# Patient Record
Sex: Female | Born: 1950 | Race: White | Hispanic: No | Marital: Married | Smoking: Never smoker
Health system: Southern US, Community
[De-identification: ages and names within clinical notes are randomized; demographics above are authoritative.]

## PROBLEM LIST (undated history)

## (undated) DIAGNOSIS — I10 Essential (primary) hypertension: Secondary | ICD-10-CM

## (undated) DIAGNOSIS — E079 Disorder of thyroid, unspecified: Secondary | ICD-10-CM

## (undated) DIAGNOSIS — K219 Gastro-esophageal reflux disease without esophagitis: Secondary | ICD-10-CM

## (undated) DIAGNOSIS — J45909 Unspecified asthma, uncomplicated: Secondary | ICD-10-CM

## (undated) DIAGNOSIS — J302 Other seasonal allergic rhinitis: Secondary | ICD-10-CM

## (undated) HISTORY — PX: CHOLECYSTECTOMY: SHX55

## (undated) HISTORY — PX: ABDOMINAL HYSTERECTOMY: SHX81

---

## 2008-06-05 ENCOUNTER — Encounter: Payer: Self-pay | Admitting: Internal Medicine

## 2008-06-05 LAB — CONVERTED CEMR LAB
Albumin: 4.7 g/dL
BUN: 18 mg/dL
Calcium: 9.7 mg/dL
Creatinine, Ser: 0.73 mg/dL
Glucose, Bld: 108 mg/dL
HDL: 53 mg/dL
Hemoglobin: 12.1 g/dL
LDL Cholesterol: 138 mg/dL
MCV: 88.3 fL
Monocytes Relative: 8.6 %
Neutrophils Relative %: 47.5 %
Platelets: 203 10*3/uL
RDW: 13.9 %
WBC: 4.7 10*3/uL

## 2009-06-17 ENCOUNTER — Encounter: Payer: Self-pay | Admitting: Internal Medicine

## 2009-06-17 LAB — CONVERTED CEMR LAB
Albumin: 4.8 g/dL
Calcium: 9.8 mg/dL
Cholesterol: 233 mg/dL
Creatinine, Ser: 0.73 mg/dL
Glucose, Bld: 104 mg/dL
Potassium: 4 meq/L
Sodium: 137 meq/L
Triglyceride fasting, serum: 96 mg/dL

## 2009-10-06 ENCOUNTER — Ambulatory Visit: Payer: Self-pay | Admitting: Diagnostic Radiology

## 2009-10-06 ENCOUNTER — Encounter: Payer: Self-pay | Admitting: Emergency Medicine

## 2009-10-06 ENCOUNTER — Encounter: Payer: Self-pay | Admitting: Internal Medicine

## 2009-10-06 LAB — CONVERTED CEMR LAB
AST: 38 units/L
Albumin: 4.2 g/dL
Alkaline Phosphatase: 72 units/L
CO2: 20 meq/L
Calcium: 9.3 mg/dL
Chloride: 95 meq/L
Creatinine, Ser: 0.8 mg/dL
Eosinophils Relative: 1 %
HCT: 37 %
MCV: 87.1 fL
Monocytes Relative: 8 %
Neutrophils Relative %: 61 %
Platelets: 313 10*3/uL
RBC: 4.24 M/uL
RDW: 11.8 %
TSH: 0.543 microintl units/mL

## 2009-10-07 ENCOUNTER — Observation Stay (HOSPITAL_COMMUNITY): Admission: EM | Admit: 2009-10-07 | Discharge: 2009-10-08 | Payer: Self-pay | Admitting: Internal Medicine

## 2009-10-07 ENCOUNTER — Encounter: Payer: Self-pay | Admitting: Internal Medicine

## 2009-10-07 DIAGNOSIS — R112 Nausea with vomiting, unspecified: Secondary | ICD-10-CM | POA: Insufficient documentation

## 2009-10-07 DIAGNOSIS — R1013 Epigastric pain: Secondary | ICD-10-CM

## 2009-10-07 LAB — CONVERTED CEMR LAB
BUN: 1 mg/dL
CO2: 23 meq/L
Calcium: 9 mg/dL
Glucose, Bld: 126 mg/dL

## 2009-10-11 ENCOUNTER — Encounter: Payer: Self-pay | Admitting: Internal Medicine

## 2009-10-11 DIAGNOSIS — K589 Irritable bowel syndrome without diarrhea: Secondary | ICD-10-CM

## 2009-10-11 DIAGNOSIS — E039 Hypothyroidism, unspecified: Secondary | ICD-10-CM

## 2009-10-11 DIAGNOSIS — E119 Type 2 diabetes mellitus without complications: Secondary | ICD-10-CM

## 2009-10-11 DIAGNOSIS — I10 Essential (primary) hypertension: Secondary | ICD-10-CM | POA: Insufficient documentation

## 2009-10-11 DIAGNOSIS — J45909 Unspecified asthma, uncomplicated: Secondary | ICD-10-CM

## 2009-10-12 ENCOUNTER — Encounter (INDEPENDENT_AMBULATORY_CARE_PROVIDER_SITE_OTHER): Payer: Self-pay | Admitting: *Deleted

## 2009-10-12 ENCOUNTER — Ambulatory Visit: Payer: Self-pay | Admitting: Internal Medicine

## 2009-10-12 DIAGNOSIS — G43909 Migraine, unspecified, not intractable, without status migrainosus: Secondary | ICD-10-CM

## 2009-10-12 DIAGNOSIS — Z860100 Personal history of colon polyps, unspecified: Secondary | ICD-10-CM | POA: Insufficient documentation

## 2009-10-12 DIAGNOSIS — B370 Candidal stomatitis: Secondary | ICD-10-CM | POA: Insufficient documentation

## 2009-10-12 DIAGNOSIS — J309 Allergic rhinitis, unspecified: Secondary | ICD-10-CM | POA: Insufficient documentation

## 2009-10-12 DIAGNOSIS — E785 Hyperlipidemia, unspecified: Secondary | ICD-10-CM

## 2009-10-12 DIAGNOSIS — K209 Esophagitis, unspecified: Secondary | ICD-10-CM

## 2009-10-12 DIAGNOSIS — Z87448 Personal history of other diseases of urinary system: Secondary | ICD-10-CM | POA: Insufficient documentation

## 2009-10-14 ENCOUNTER — Encounter: Payer: Self-pay | Admitting: Internal Medicine

## 2010-04-26 NOTE — Letter (Signed)
Summary: New Patient letter  [facility]  [address] [place], [place] [address]   Phone: [phone]  Fax: [phone]       10/12/2009 MRN: [id number]  Lynn Stone [address] [place], [place]  [address]  Dear Ms. Kayleen Memos,  Welcome to the Gastroenterology Division at [facility].    You are scheduled to see Dr.  Sheryn Bison on November 18, 2009 at 8:30am on the 3rd floor at [facility], [address]  We ask that you try to arrive at our office 15 minutes prior to your appointment time to allow for check-in.  We would like you to complete the enclosed self-administered evaluation form prior to your visit and bring it with you on the day of your appointment.  We will review it with you.  Also, please bring a complete list of all your medications or, if you prefer, bring the medication bottles and we will list them.  Please bring your insurance card so that we may make a copy of it.  If your insurance requires a referral to see a specialist, please bring your referral form from your primary care physician.  Co-payments are due at the time of your visit and may be paid by cash, check or credit card.     Your office visit will consist of a consult with your physician (includes a physical exam), any laboratory testing he/she may order, scheduling of any necessary diagnostic testing (e.g. x-ray, ultrasound, CT-scan), and scheduling of a procedure (e.g. Endoscopy, Colonoscopy) if required.  Please allow enough time on your schedule to allow for any/all of these possibilities.    If you cannot keep your appointment, please call [phone] to cancel or reschedule prior to your appointment date.  This allows [place] the opportunity to schedule an appointment for another patient in need of care.  If you do not cancel or reschedule by 5 p.m. the business day prior to your appointment date, you will be charged a $50.00 late cancellation/no-show fee.    Thank you for  choosing [facility] for your medical needs.  We appreciate the opportunity to care for you.  Please visit [place] at our website  to learn more about our practice.                     Sincerely,                                                             The Gastroenterology Division

## 2010-04-26 NOTE — Assessment & Plan Note (Signed)
Summary: new / [facility] / dr tempe @[facility]  [place] made appt/cd   Vital Signs:  Patient profile:   60 year old female Weight:      158.6 pounds (72.09 kg) O2 Sat:      95 % on Room air Temp:     98.3 degrees F (36.83 degrees C) oral Pulse rate:   75 / minute BP sitting:   130 / 80  (left arm) Cuff size:   large  Vitals Entered By: Orlan Leavens (October 12, 2009 8:21 AM)  O2 Flow:  Room air CC: New patient Is Patient Diabetic? Yes Did you bring your meter with you today? No Pain Assessment Patient in pain? no        Primary Care Provider:  Newt Lukes MD  CC:  New patient.  History of Present Illness: new pt to me and our practice,  only temp in [place] but est care here for needs that arise while away from home ([place] area)  c/o dysphagia and ondynophagia - onset 3 weeks ago - worsening course - 10/10 at worst (prompting hosp eval and tx) describes as pain burning and sharp, worse with any swallow activity, solids or liquids - pain is constant through SSchest into epigastrium - no radiation into back - exac by recent abx use for "cold" (doxy rx'd) denies weight loss or fever, no change in bowels or BRBPR/melena- pain required overnight hosp for pill esophagitis related to same - hosp 7/14 reviewed - pain symptoms now improved with two times a day ppi - using protonix in place of prev nexium - hx similar symtpoms: GERD and esoph stricrtures requiring dialation in past  Preventive Screening-Counseling & Management  Alcohol-Tobacco     Alcohol drinks/day: 2     Alcohol Counseling: not indicated; use of alcohol is not excessive or problematic     Smoking Status: quit     Tobacco Counseling: not to resume use of tobacco products  Caffeine-Diet-Exercise     Caffeine Counseling: not indicated; caffeine use is not excessive or problematic     Does Patient Exercise: yes     Times/week: 5     Exercise Counseling: not indicated; exercise is adequate     Depression Counseling:  not indicated; screening negative for depression  Safety-Violence-Falls     Seat Belt Counseling: not indicated; patient wears seat belts     Helmet Counseling: not indicated; patient wears helmet when riding bicycle/motocycle     Firearms in the Home: no firearms in the home     Firearm Counseling: not applicable     Smoke Detectors: yes     Violence in the Home: no risk noted     Fall Risk Counseling: not indicated; no significant falls noted  Clinical Review Panels:  Complete Metabolic Panel   Glucose:  126 (10/07/2009)   Sodium:  128 (10/07/2009)   Potassium:  3.6 (10/07/2009)   Chloride:  97 (10/07/2009)   CO2:  23 (10/07/2009)   BUN:  1 (10/07/2009)   Creatinine:  0.60 (10/07/2009)   Albumin:  4.2 (10/06/2009)   Total Protein:  7.4 (10/06/2009)   Calcium:  9.0 (10/07/2009)   Total Bili:  0.6 (10/06/2009)   Alk Phos:  72 (10/06/2009)   SGPT (ALT):  20 (10/06/2009)   SGOT (AST):  38 (10/06/2009)   Current Medications (verified): 1)  Zofran 4 Mg Tabs (Ondansetron Hcl) .... Take 1 Q 6 Hours As Needed 2)  Protonix 40 Mg Tbec (Pantoprazole Sodium) .... Take  1 By Mouth Two Times A Day For 3 Weeks and Then 1 Tablet in Q Am 3)  Advair Diskus 250-50 Mcg/dose Aepb (Fluticasone-Salmeterol) .... Take 1 Puff Two Times A Day 4)  Ramipril 10 Mg Caps (Ramipril) .... Take 1 By Mouth Once Daily 5)  Synthroid 100 Mcg Tabs (Levothyroxine Sodium) .... Take 1 By Mouth Once Daily 6)  Tylenol Extra Strength 500 Mg Tabs (Acetaminophen) .... Use As Needed 7)  Carafate 1 Gm/36ml Susp (Sucralfate) .... Take 1 Teaspoon Qid For 3 Weeks  Allergies (verified): No Known Drug Allergies  Past History:  Past Medical History: Asthma Diabetes mellitus, type II, diet controlled Hypertension Hypothyroidism Allergic rhinitis Colonic polyps, hx of Hyperlipidemia Fibromyalgia GERD - hx esophageal strictures, pill esophagitis hosp 10/07/09 IBS  Past Surgical History: Cholecystectomy  (1992) Hysterectomy (1981)  Family History: Family History of Arthritis Family History of Colon CA 1st degree relative <60 Family History High cholesterol Family History Hypertension Family History of Prostate CA 1st degree relative <50  Social History: Former Smoker,  "moderate" alcohol per her description - married, lives with spouse - only temporary residence in [place] until spouse completed with job at [employer] - returning to [place] area soon - Smoking Status:  quit Does Patient Exercise:  yes  Review of Systems       c/o allergic rhinitis since moving to [place] area x 3 weeks, intol of antihistamine tx SE (dryness of mouth/eyes); otherwise, see HPI above. I have reviewed all other systems and they were negative.   Physical Exam  General:  alert, well-developed, well-nourished, and cooperative to examination.   nontoxic Eyes:  vision grossly intact; pupils equal, round and reactive to light.  conjunctiva and lids normal.    Ears:  normal pinnae bilaterally, without erythema, swelling, or tenderness to palpation. TMs clear, without effusion, or cerumen impaction. Hearing grossly normal bilaterally  Mouth:  teeth and gums in good repair; mucous membranes moist, with candica changes across tongue and OP. no exudate, mod erythema. +clear pnd Neck:  supple, full ROM, no masses, no thyromegaly; no thyroid nodules or tenderness. no JVD or carotid bruits.   Lungs:  normal respiratory effort, no intercostal retractions or use of accessory muscles; normal breath sounds bilaterally - no crackles and no wheezes.    Heart:  normal rate, regular rhythm, no murmur, and no rub. BLE without edema. Abdomen:  soft, non-tender, normal bowel sounds, no distention; no masses and no appreciable hepatomegaly or splenomegaly.   Skin:  no rashes, vesicles, ulcers, or erythema. No nodules or irregularity to palpation.  Psych:  Oriented X3, memory intact for recent and remote, normally interactive, good eye  contact, not anxious appearing, not depressed appearing, and not agitated.      Impression & Recommendations:  Problem # 1:  ESOPHAGITIS (ICD-530.10) recent hosp reviewed -  improved but not resolved - rec cont two times a day ppi and refer to GI for eval (pt request) ?candidasis given DM (diet controlled) and oral findings c/w same - will send for prior records including esoph dialtion hx from [place] -  The following medications were removed from the medication list:    Carafate 1 Gm Tabs (Sucralfate) .Marland Kitchen... Take 1 four times a day for 3  weeks Her updated medication list for this problem includes:    Protonix 40 Mg Tbec (Pantoprazole sodium) .Marland Kitchen... Take 1 by mouth two times a day for 3 weeks and then 1 tablet in q am    Carafate 1 Gm/92ml Susp (  Sucralfate) .Marland Kitchen... Take 1 teaspoon qid for 3 weeks  Orders: Gastroenterology Referral (GI)  Problem # 2:  CANDIDIASIS OF MOUTH (ICD-112.0)  tx with nystatin swish and swallow -  ?affecting esophagitis symptoms -  Orders: Prescription Created Electronically [phone])  Problem # 3:  HYPERLIPIDEMIA (ICD-272.4) send for records Labs Reviewed: SGOT: 38 (10/06/2009)   SGPT: 20 (10/06/2009)  Problem # 4:  HYPOTHYROIDISM (ICD-244.9)  send for records - cont same Her updated medication list for this problem includes:    Synthroid 100 Mcg Tabs (Levothyroxine sodium) .Marland Kitchen... Take 1 by mouth once daily  Labs Reviewed: TSH: 0.543 (10/06/2009)     Problem # 5:  DIABETES MELLITUS, TYPE II (ICD-250.00)  diet controlled per pt hx - consider checking a1c if not recently done in [place] - records requested Her updated medication list for this problem includes:    Ramipril 10 Mg Caps (Ramipril) .Marland Kitchen... Take 1 by mouth once daily  Labs Reviewed: Creat: 0.60 (10/07/2009)     Complete Medication List: 1)  Zofran 4 Mg Tabs (Ondansetron hcl) .... Take 1 q 6 hours as needed 2)  Protonix 40 Mg Tbec (Pantoprazole sodium) .... Take 1 by mouth two times a day for 3  weeks and then 1 tablet in q am 3)  Advair Diskus 250-50 Mcg/dose Aepb (Fluticasone-salmeterol) .... Take 1 puff two times a day 4)  Ramipril 10 Mg Caps (Ramipril) .... Take 1 by mouth once daily 5)  Synthroid 100 Mcg Tabs (Levothyroxine sodium) .... Take 1 by mouth once daily 6)  Tylenol Extra Strength 500 Mg Tabs (Acetaminophen) .... Use as needed 7)  Carafate 1 Gm/33ml Susp (Sucralfate) .... Take 1 teaspoon qid for 3 weeks 8)  Nystatin 100000 Unit/ml Susp (Nystatin) .Marland Kitchen.. 10 cc swish, gargle and spit four times a day 9)  Align Caps (Probiotic product) .Marland Kitchen.. 1 by mouth once daily  Patient Instructions: 1)  it was good to see you today. 2)  take medication for allergy symptoms as discussed - no need for antibiotics at this time - 3)  Nystatin mouthwash for the yeast as discussed - your prescription has been electronically submitted to your local pharmacy. Please take as directed. Contact our office if you believe you're having problems with the medication(s).  4)  we'll make referral to [facility] for evaluation of your swallow problems and pain . Our office will contact you regarding this appointment once made.  5)  otherwise, no medicine changes 6)  will send for copy of records from your dr, Laural Benes to review as discussed - 7)  Please schedule a follow-up appointment as needed while your are in this area, call if problems.  Prescriptions: NYSTATIN 100000 UNIT/ML SUSP (NYSTATIN) 10 cc swish, gargle and spit four times a day  #1 large x 1   Entered and Authorized by:   Newt Lukes MD   Signed by:   Newt Lukes MD on 10/12/2009   Method used:   Electronically to        [facility] [address] [id number] (retail)       [address]       [place], [place]  [address]       Ph: [phone]       Fax: [phone]   RxID:   [id number]

## 2010-06-12 LAB — COMPREHENSIVE METABOLIC PANEL
Alkaline Phosphatase: 72 U/L (ref 39–117)
BUN: 7 mg/dL (ref 6–23)
Creatinine, Ser: 0.8 mg/dL (ref 0.4–1.2)
Potassium: 2.9 mEq/L — ABNORMAL LOW (ref 3.5–5.1)
Sodium: 128 mEq/L — ABNORMAL LOW (ref 135–145)
Total Bilirubin: 0.6 mg/dL (ref 0.3–1.2)

## 2010-06-12 LAB — RAPID STREP SCREEN (MED CTR MEBANE ONLY): Streptococcus, Group A Screen (Direct): NEGATIVE

## 2010-06-12 LAB — URINALYSIS, ROUTINE W REFLEX MICROSCOPIC
Bilirubin Urine: NEGATIVE
Hgb urine dipstick: NEGATIVE
Specific Gravity, Urine: 1.003 — ABNORMAL LOW (ref 1.005–1.030)
Urobilinogen, UA: 0.2 mg/dL (ref 0.0–1.0)
pH: 6.5 (ref 5.0–8.0)

## 2010-06-12 LAB — CBC
Hemoglobin: 12.8 g/dL (ref 12.0–15.0)
MCH: 30.1 pg (ref 26.0–34.0)
MCHC: 34.6 g/dL (ref 30.0–36.0)
RDW: 11.8 % (ref 11.5–15.5)
WBC: 7.8 10*3/uL (ref 4.0–10.5)

## 2010-06-12 LAB — POCT CARDIAC MARKERS
Myoglobin, poc: 41.5 ng/mL (ref 12–200)
Troponin i, poc: <0.05 ng/mL (ref 0.00–0.09)

## 2010-06-12 LAB — LIPASE, BLOOD: Lipase: 391 U/L — ABNORMAL HIGH (ref 23–300)

## 2010-06-12 LAB — BASIC METABOLIC PANEL
BUN: 1 mg/dL — ABNORMAL LOW (ref 6–23)
CO2: 23 mEq/L (ref 19–32)
Calcium: 9 mg/dL (ref 8.4–10.5)
Chloride: 97 mEq/L (ref 96–112)
Glucose, Bld: 126 mg/dL — ABNORMAL HIGH (ref 70–99)
Sodium: 128 mEq/L — ABNORMAL LOW (ref 135–145)

## 2010-06-12 LAB — ETHANOL: Alcohol, Ethyl (B): <10 mg/dL (ref 0–10)

## 2010-06-12 LAB — DIFFERENTIAL
Basophils Absolute: 0 10*3/uL (ref 0.0–0.1)
Basophils Relative: 0 % (ref 0–1)
Eosinophils Relative: 1 % (ref 0–5)
Monocytes Relative: 8 % (ref 3–12)

## 2010-06-12 LAB — TSH: TSH: 0.543 u[IU]/mL (ref 0.350–4.500)

## 2014-02-11 ENCOUNTER — Emergency Department
Admission: EM | Admit: 2014-02-11 | Discharge: 2014-02-11 | Disposition: A | Discharge status: Home / Self Care | Payer: Managed Care, Other (non HMO) | Source: Home / Self Care | Attending: Emergency Medicine | Admitting: Emergency Medicine

## 2014-02-11 ENCOUNTER — Encounter: Payer: Self-pay | Admitting: *Deleted

## 2014-02-11 DIAGNOSIS — H6981 Other specified disorders of Eustachian tube, right ear: Secondary | ICD-10-CM

## 2014-02-11 DIAGNOSIS — H9201 Otalgia, right ear: Secondary | ICD-10-CM

## 2014-02-11 DIAGNOSIS — J4520 Mild intermittent asthma, uncomplicated: Secondary | ICD-10-CM

## 2014-02-11 DIAGNOSIS — R258 Other abnormal involuntary movements: Secondary | ICD-10-CM

## 2014-02-11 DIAGNOSIS — J0100 Acute maxillary sinusitis, unspecified: Secondary | ICD-10-CM

## 2014-02-11 DIAGNOSIS — R253 Fasciculation: Secondary | ICD-10-CM

## 2014-02-11 HISTORY — DX: Disorder of thyroid, unspecified: E07.9

## 2014-02-11 HISTORY — DX: Unspecified asthma, uncomplicated: J45.909

## 2014-02-11 HISTORY — DX: Gastro-esophageal reflux disease without esophagitis: K21.9

## 2014-02-11 HISTORY — DX: Essential (primary) hypertension: I10

## 2014-02-11 HISTORY — DX: Other seasonal allergic rhinitis: J30.2

## 2014-02-11 MED ORDER — CEFTRIAXONE SODIUM 250 MG IJ SOLR
500.0000 mg | Freq: Once | INTRAMUSCULAR | Status: AC
  Administered 2014-02-11: 500 mg via INTRAMUSCULAR

## 2014-02-11 MED ORDER — PREDNISONE 20 MG PO TABS: 20.0000 mg | ORAL_TABLET | Freq: Two times a day (BID) | ORAL | Status: DC

## 2014-02-11 MED ORDER — ALBUTEROL SULFATE HFA 108 (90 BASE) MCG/ACT IN AERS: 2.0000 | INHALATION_SPRAY | RESPIRATORY_TRACT | Status: AC | PRN

## 2014-02-11 MED ORDER — FLUTICASONE PROPIONATE 50 MCG/ACT NA SUSP: NASAL | Status: AC

## 2014-02-11 MED ORDER — AMOXICILLIN 875 MG PO TABS: ORAL_TABLET | ORAL | Status: DC

## 2014-02-11 NOTE — ED Provider Notes (Addendum)
CSN: [id number]     Arrival date & time 02/11/14  1529 History   First MD Initiated Contact with Patient 02/11/14 1621     Chief Complaint  Patient presents with  . Otalgia  . Eye Problem   Lynn Stone is a 63 year old female. Her PCP is Dr. Luella Cook in [place]. Family moved here from [place] temporarily, but is moving back to [place] next month. She presents to [facility] with numerous symptoms.  HPI Chief complaint right ear and facial pain and right eye twitching intermittently for 5 days. She feels her seasonal allergies have flared up which could contribute to sinus congestion and sinus pain. Has vague feeling of numbness right side of face. Denies focal weakness or other focal neurologic symptoms. She states that sodium and potassium have been abnormal in the past when she's been dehydrated and she requests labs drawn today to check potassium and sodium. She requests a refill her albuterol HFA in case she were to need it. She has all of her other prescriptions including Altace, Synthroid, Nexium, Advair.--She states she is compliant with those meds I questioned her about fever, initially she is not sure if she has had fever but she's felt warm. Had some chills and night sweats. Also complains of myalgias, fatigue, vague nonfocal headache, sore throat, sinus congestion, hoarseness. Has decreased appetite past few days but tolerating by mouth liquids denies nausea or vomiting. Had one semi-loose stool today. No blood or mucus in stool. History of chronic asthma, but denies recent asthma flareup. Denies wheezing or shortness of breath or chest pain. Denies any urinary symptoms. No dysuria or hematuria. Denies abdominal or pelvic pain. No seizures or syncope. Denies tick bite or rash. Past Medical History  Diagnosis Date  . Asthma   . Seasonal allergies   . GERD (gastroesophageal reflux disease)   . Hypertension   . Thyroid disease    Past Surgical History   Procedure Laterality Date  . Cesarean section      x 3  . Abdominal hysterectomy    . Cholecystectomy     Family History  Problem Relation Age of Onset  . Heart disease Father    History  Substance Use Topics  . Smoking status: Never Smoker   . Smokeless tobacco: Not on file  . Alcohol Use: No   OB History    No data available     Review of Systems  All other systems reviewed and are negative.   Allergies  Eggs or egg-derived products and Erythromycin  Home Medications   Prior to Admission medications   Medication Sig Start Date End Date Taking? Authorizing Provider  albuterol (PROVENTIL HFA;VENTOLIN HFA) 108 (90 BASE) MCG/ACT inhaler Inhale into the lungs every 6 (six) hours as needed for wheezing or shortness of breath.   Yes Historical Provider, MD  esomeprazole (NEXIUM) 40 MG capsule Take 40 mg by mouth daily at 12 noon.   Yes Historical Provider, MD  Fluticasone-Salmeterol (ADVAIR DISKUS) 250-50 MCG/DOSE AEPB Inhale 1 puff into the lungs 2 (two) times daily.   Yes Historical Provider, MD  levothyroxine (SYNTHROID, LEVOTHROID) 100 MCG tablet Take 100 mcg by mouth daily before breakfast.   Yes Historical Provider, MD  ramipril (ALTACE) 10 MG capsule Take 10 mg by mouth daily.   Yes Historical Provider, MD  albuterol (PROVENTIL HFA;VENTOLIN HFA) 108 (90 BASE) MCG/ACT inhaler Inhale 2 puffs into the lungs every 4 (four) hours as needed for wheezing or shortness of breath. 02/11/14  Jacqulyn Cane, MD  amoxicillin (AMOXIL) 875 MG tablet Take 1 twice a day X 10 days. 02/11/14   Jacqulyn Cane, MD  fluticasone Asencion Islam) 50 MCG/ACT nasal spray 1 or 2 sprays each nostril twice a day 02/11/14   Jacqulyn Cane, MD  predniSONE (DELTASONE) 20 MG tablet Take 1 tablet (20 mg total) by mouth 2 (two) times daily with a meal. X 5 days 02/11/14   Jacqulyn Cane, MD   BP 188/91 mmHg  Pulse 77  Temp(Src) 97.6 F (36.4 C) (Oral)  Resp 18  Ht 4\' 11"  (1.499 m)  Wt 170 lb (77.111 kg)  BMI  34.32 kg/m2  SpO2 99% Physical Exam  Constitutional: She is oriented to person, place, and time. She appears well-developed and well-nourished. No distress.  Alert, pleasant female. No acute distress. She appears uncomfortable and anxious. Cooperative. Talkative  HENT:  Head: Normocephalic and atraumatic. Head is without right periorbital erythema and without left periorbital erythema.  Right Ear: External ear and ear canal normal. Tympanic membrane is injected. Tympanic membrane is not perforated and not erythematous. A middle ear effusion is present.  Left Ear: Tympanic membrane, external ear and ear canal normal.  Nose: Mucosal edema and rhinorrhea present. Right sinus exhibits maxillary sinus tenderness. Left sinus exhibits maxillary sinus tenderness.  Mouth/Throat: Oropharynx is clear and moist and mucous membranes are normal. No oral lesions. No oropharyngeal exudate.  Eyes: Conjunctivae, EOM and lids are normal. Pupils are equal, round, and reactive to light. Right eye exhibits no discharge. Left eye exhibits no discharge. Right conjunctiva has no hemorrhage. Left conjunctiva has no hemorrhage. No scleral icterus.  Occasional twitching right eyelid noted. No other eye abnormality noted  Neck: Trachea normal. Neck supple. No JVD present. No rigidity.  No adenopathy  Cardiovascular: Normal rate, regular rhythm and normal heart sounds.   Pulmonary/Chest: Effort normal and breath sounds normal. No respiratory distress. She has no wheezes. She has no rales.  Abdominal: Soft. She exhibits no distension.  Musculoskeletal: She exhibits no edema.  Lymphadenopathy:    She has no cervical adenopathy.  Neurological: She is alert and oriented to person, place, and time. She displays normal reflexes. No cranial nerve deficit. She exhibits normal muscle tone. Coordination normal.  No focal neurologic deficit. Motor and sensory intact.  Skin: Skin is warm and dry. No rash noted.  Psychiatric: She has  a normal mood and affect.  Mildly anxious  Nursing note and vitals reviewed.   ED Course  Procedures (including critical care time) Labs Review Labs Reviewed  BASIC METABOLIC PANEL    Imaging Review No results found.   MDM   1. Acute maxillary sinusitis, recurrence not specified   2. Otalgia of right ear   3. Eustachian tube dysfunction, right   4. Muscle twitching   5. Asthma, chronic, mild intermittent, uncomplicated     Over 45 minutes spent, greater than 50% of the time spent for counseling and coordination of care. She had numerous questions and I answered as best I could. I explained that neurologic exam intact, no evidence of facial asymmetry or neurologic deficit.  At her request, BMP drawn to check sodium and potassium. Antibiotic choices discussed. She prefers shot of antibiotic, Rocephin 500 IM given. Prescribed amoxicillin 875 twice a day Prescribed Flonase. At her request, I refilled albuterol HFA (use prn) because she ran out. She does not have any wheezing today on exam Names and phone numbers of ENT  given. Follow-up with ENT in 3-4 days. Precautions  discussed. Red flags discussed.--Emergency room if any red flag. Questions invited and answered. Patient voiced understanding and agreement. BP rechecked by me 158/88. She admits that she is somewhat anxious today. After talking with her, she she stated that felt less anxious. Push clear liquids. Advance to regular diet as tolerated. Other advice given.  Discharge Medication List as of 02/11/2014  4:38 PM    START taking these medications   Details   albuterol (PROVENTIL HFA;VENTOLIN HFA) 108 (90 BASE) MCG/ACT inhaler Inhale 2 puffs into the lungs every 4 (four) hours as needed for wheezing or shortness of breath    amoxicillin (AMOXIL) 875 MG tablet Take 1 twice a day X 10 days    fluticasone (FLONASE) 50 MCG/ACT nasal spray 1 or 2 sprays each nostril twice a day    predniSONE (DELTASONE) 20 MG tablet  Take 1 tablet (20 mg total) by mouth 2 (two) times daily with a meal. X 5 days, Starting 02/11/2014, Until Discontinued         Follow-up with your primary care doctor in 5-7 days, or sooner if symptoms become worse. Precautions discussed. Red flags discussed.--Emergency room if any red flag Questions invited and answered. Patient voiced understanding and agreement.    Jacqulyn Cane, MD 02/12/14 8335  Jacqulyn Cane, MD 02/12/14 [phone]

## 2014-02-11 NOTE — ED Notes (Signed)
Pt c/o RT ear pain with RT eye twitching x 5 days. She reports a hx of seasonal allergies. Denies fever. She also reports that it feels like the RT side of her head is numb. She request to have labs drawn to check potassium and sodium.

## 2014-02-12 LAB — BASIC METABOLIC PANEL
BUN: 10 mg/dL (ref 6–23)
CO2: 24 mEq/L (ref 19–32)
Calcium: 10.2 mg/dL (ref 8.4–10.5)
Chloride: 91 mEq/L — ABNORMAL LOW (ref 96–112)
Creat: 0.53 mg/dL (ref 0.50–1.10)
Glucose, Bld: 94 mg/dL (ref 70–99)
Potassium: 4 mEq/L (ref 3.5–5.3)
Sodium: 124 mEq/L — ABNORMAL LOW (ref 135–145)

## 2014-02-12 MED ORDER — AMOXICILLIN 250 MG/5ML PO SUSR: 500.0000 mg | Freq: Three times a day (TID) | ORAL | Status: DC

## 2014-02-12 MED ORDER — PREDNISONE 5 MG/5ML PO SOLN: 20.0000 mg | Freq: Two times a day (BID) | ORAL | Status: DC

## 2014-02-17 ENCOUNTER — Telehealth: Payer: Self-pay | Admitting: *Deleted

## 2016-04-23 ENCOUNTER — Encounter: Payer: Self-pay | Admitting: Student

## 2019-03-17 ENCOUNTER — Other Ambulatory Visit: Payer: Self-pay

## 2019-03-17 ENCOUNTER — Emergency Department (HOSPITAL_COMMUNITY): Payer: Medicare HMO

## 2019-03-17 ENCOUNTER — Inpatient Hospital Stay (HOSPITAL_COMMUNITY)
Admission: EM | Admit: 2019-03-17 | Discharge: 2019-03-20 | DRG: 645 | Disposition: A | Discharge status: Home / Self Care | Payer: Medicare HMO | Attending: Internal Medicine | Admitting: Internal Medicine

## 2019-03-17 ENCOUNTER — Observation Stay (HOSPITAL_COMMUNITY): Payer: Medicare HMO

## 2019-03-17 ENCOUNTER — Encounter (HOSPITAL_COMMUNITY): Payer: Self-pay | Admitting: Emergency Medicine

## 2019-03-17 DIAGNOSIS — G43A0 Cyclical vomiting, not intractable: Secondary | ICD-10-CM | POA: Diagnosis present

## 2019-03-17 DIAGNOSIS — E871 Hypo-osmolality and hyponatremia: Secondary | ICD-10-CM | POA: Diagnosis present

## 2019-03-17 DIAGNOSIS — R112 Nausea with vomiting, unspecified: Secondary | ICD-10-CM | POA: Diagnosis not present

## 2019-03-17 DIAGNOSIS — E876 Hypokalemia: Secondary | ICD-10-CM | POA: Diagnosis present

## 2019-03-17 DIAGNOSIS — Z881 Allergy status to other antibiotic agents status: Secondary | ICD-10-CM

## 2019-03-17 DIAGNOSIS — R197 Diarrhea, unspecified: Secondary | ICD-10-CM | POA: Diagnosis present

## 2019-03-17 DIAGNOSIS — Z9049 Acquired absence of other specified parts of digestive tract: Secondary | ICD-10-CM

## 2019-03-17 DIAGNOSIS — K209 Esophagitis, unspecified without bleeding: Secondary | ICD-10-CM | POA: Diagnosis present

## 2019-03-17 DIAGNOSIS — E039 Hypothyroidism, unspecified: Secondary | ICD-10-CM | POA: Diagnosis present

## 2019-03-17 DIAGNOSIS — K2100 Gastro-esophageal reflux disease with esophagitis, without bleeding: Secondary | ICD-10-CM | POA: Diagnosis present

## 2019-03-17 DIAGNOSIS — J302 Other seasonal allergic rhinitis: Secondary | ICD-10-CM | POA: Diagnosis present

## 2019-03-17 DIAGNOSIS — E119 Type 2 diabetes mellitus without complications: Secondary | ICD-10-CM | POA: Diagnosis present

## 2019-03-17 DIAGNOSIS — Z90710 Acquired absence of both cervix and uterus: Secondary | ICD-10-CM

## 2019-03-17 DIAGNOSIS — E785 Hyperlipidemia, unspecified: Secondary | ICD-10-CM | POA: Diagnosis present

## 2019-03-17 DIAGNOSIS — Z20828 Contact with and (suspected) exposure to other viral communicable diseases: Secondary | ICD-10-CM | POA: Diagnosis present

## 2019-03-17 DIAGNOSIS — J45909 Unspecified asthma, uncomplicated: Secondary | ICD-10-CM

## 2019-03-17 DIAGNOSIS — Z79899 Other long term (current) drug therapy: Secondary | ICD-10-CM

## 2019-03-17 DIAGNOSIS — Z8249 Family history of ischemic heart disease and other diseases of the circulatory system: Secondary | ICD-10-CM

## 2019-03-17 DIAGNOSIS — G43909 Migraine, unspecified, not intractable, without status migrainosus: Secondary | ICD-10-CM | POA: Diagnosis present

## 2019-03-17 DIAGNOSIS — K589 Irritable bowel syndrome without diarrhea: Secondary | ICD-10-CM | POA: Diagnosis present

## 2019-03-17 DIAGNOSIS — I10 Essential (primary) hypertension: Secondary | ICD-10-CM | POA: Diagnosis not present

## 2019-03-17 DIAGNOSIS — Z79890 Hormone replacement therapy (postmenopausal): Secondary | ICD-10-CM

## 2019-03-17 DIAGNOSIS — Z91012 Allergy to eggs: Secondary | ICD-10-CM

## 2019-03-17 DIAGNOSIS — D72829 Elevated white blood cell count, unspecified: Secondary | ICD-10-CM | POA: Diagnosis present

## 2019-03-17 DIAGNOSIS — E222 Syndrome of inappropriate secretion of antidiuretic hormone: Principal | ICD-10-CM | POA: Diagnosis present

## 2019-03-17 DIAGNOSIS — G4700 Insomnia, unspecified: Secondary | ICD-10-CM | POA: Diagnosis present

## 2019-03-17 LAB — COMPREHENSIVE METABOLIC PANEL
ALT: 26 U/L (ref 0–44)
AST: 32 U/L (ref 15–41)
Albumin: 4.7 g/dL (ref 3.5–5.0)
Alkaline Phosphatase: 74 U/L (ref 38–126)
Anion gap: 14 (ref 5–15)
BUN: 8 mg/dL (ref 8–23)
CO2: 22 mmol/L (ref 22–32)
Calcium: 9.6 mg/dL (ref 8.9–10.3)
Chloride: 79 mmol/L — ABNORMAL LOW (ref 98–111)
Creatinine, Ser: 0.62 mg/dL (ref 0.44–1.00)
GFR calc Af Amer: >60 mL/min (ref >60)
GFR calc non Af Amer: >60 mL/min (ref >60)
Glucose, Bld: 190 mg/dL — ABNORMAL HIGH (ref 70–99)
Potassium: 2.8 mmol/L — ABNORMAL LOW (ref 3.5–5.1)
Sodium: 115 mmol/L — CL (ref 135–145)
Total Bilirubin: 0.8 mg/dL (ref 0.3–1.2)
Total Protein: 7.5 g/dL (ref 6.5–8.1)

## 2019-03-17 LAB — URINALYSIS, ROUTINE W REFLEX MICROSCOPIC
Bacteria, UA: NONE SEEN
Bilirubin Urine: NEGATIVE
Glucose, UA: 50 mg/dL — AB
Hgb urine dipstick: NEGATIVE
Ketones, ur: 20 mg/dL — AB
Leukocytes,Ua: NEGATIVE
Nitrite: NEGATIVE
Protein, ur: 30 mg/dL — AB
Specific Gravity, Urine: 1.009 (ref 1.005–1.030)
pH: 7 (ref 5.0–8.0)

## 2019-03-17 LAB — SODIUM, URINE, RANDOM: Sodium, Ur: 64 mmol/L

## 2019-03-17 LAB — OSMOLALITY, URINE: Osmolality, Ur: 357 mOsm/kg (ref 300–900)

## 2019-03-17 LAB — LIPASE, BLOOD: Lipase: 21 U/L (ref 11–51)

## 2019-03-17 LAB — HEMOGLOBIN A1C
Hgb A1c MFr Bld: 6 % — ABNORMAL HIGH (ref 4.8–5.6)
Mean Plasma Glucose: 125.5 mg/dL

## 2019-03-17 LAB — CBC
HCT: 38.2 % (ref 36.0–46.0)
Hemoglobin: 13.6 g/dL (ref 12.0–15.0)
MCH: 28.8 pg (ref 26.0–34.0)
MCHC: 35.6 g/dL (ref 30.0–36.0)
MCV: 80.8 fL (ref 80.0–100.0)
Platelets: 320 10*3/uL (ref 150–400)
RBC: 4.73 MIL/uL (ref 3.87–5.11)
RDW: 12.3 % (ref 11.5–15.5)
WBC: 15 10*3/uL — ABNORMAL HIGH (ref 4.0–10.5)
nRBC: 0 % (ref 0.0–0.2)

## 2019-03-17 LAB — GLUCOSE, CAPILLARY: Glucose-Capillary: 141 mg/dL — ABNORMAL HIGH (ref 70–99)

## 2019-03-17 LAB — SARS CORONAVIRUS 2 (TAT 6-24 HRS): SARS Coronavirus 2: NEGATIVE

## 2019-03-17 LAB — SODIUM: Sodium: 116 mmol/L — CL (ref 135–145)

## 2019-03-17 LAB — POC SARS CORONAVIRUS 2 AG -  ED: SARS Coronavirus 2 Ag: NEGATIVE

## 2019-03-17 LAB — MAGNESIUM: Magnesium: 1.9 mg/dL (ref 1.7–2.4)

## 2019-03-17 MED ORDER — INSULIN ASPART 100 UNIT/ML ~~LOC~~ SOLN: 0.0000 [IU] | Freq: Every day | SUBCUTANEOUS | Status: DC

## 2019-03-17 MED ORDER — STERILE WATER FOR INJECTION IJ SOLN
INTRAMUSCULAR | Status: AC
  Administered 2019-03-17: 10 mL
  Filled 2019-03-17: qty 10

## 2019-03-17 MED ORDER — INSULIN ASPART 100 UNIT/ML ~~LOC~~ SOLN: 0.0000 [IU] | Freq: Three times a day (TID) | SUBCUTANEOUS | Status: DC

## 2019-03-17 MED ORDER — PANTOPRAZOLE SODIUM 40 MG IV SOLR
40.0000 mg | INTRAVENOUS | Status: DC
  Administered 2019-03-17 – 2019-03-18 (×2): 40 mg via INTRAVENOUS
  Filled 2019-03-17 (×2): qty 40

## 2019-03-17 MED ORDER — SODIUM CHLORIDE 0.9% FLUSH: 3.0000 mL | Freq: Once | INTRAVENOUS | Status: DC

## 2019-03-17 MED ORDER — POTASSIUM CHLORIDE 10 MEQ/100ML IV SOLN
10.0000 meq | INTRAVENOUS | Status: AC
  Administered 2019-03-17 (×3): 10 meq via INTRAVENOUS
  Filled 2019-03-17 (×3): qty 100

## 2019-03-17 MED ORDER — HYDRALAZINE HCL 25 MG PO TABS
25.0000 mg | ORAL_TABLET | Freq: Three times a day (TID) | ORAL | Status: DC | PRN
  Administered 2019-03-17 – 2019-03-20 (×3): 25 mg via ORAL
  Filled 2019-03-17 (×3): qty 1

## 2019-03-17 MED ORDER — ONDANSETRON HCL 4 MG/2ML IJ SOLN
4.0000 mg | Freq: Four times a day (QID) | INTRAMUSCULAR | Status: DC | PRN
  Administered 2019-03-17 – 2019-03-19 (×2): 4 mg via INTRAVENOUS
  Filled 2019-03-17 (×2): qty 2

## 2019-03-17 MED ORDER — SODIUM CHLORIDE 0.9 % IV BOLUS
500.0000 mL | Freq: Once | INTRAVENOUS | Status: AC
  Administered 2019-03-17: 500 mL via INTRAVENOUS

## 2019-03-17 MED ORDER — MAGNESIUM SULFATE 2 GM/50ML IV SOLN
2.0000 g | Freq: Once | INTRAVENOUS | Status: AC
  Administered 2019-03-17: 2 g via INTRAVENOUS
  Filled 2019-03-17: qty 50

## 2019-03-17 MED ORDER — ACETAMINOPHEN 325 MG PO TABS: 650.0000 mg | ORAL_TABLET | ORAL | Status: DC | PRN

## 2019-03-17 MED ORDER — SODIUM CHLORIDE 0.9 % IV SOLN: INTRAVENOUS | Status: DC

## 2019-03-17 NOTE — H&P (Signed)
History and Physical    Lynn Stone DOB: 1950-09-14 DOA: 03/17/2019  PCP: Brett Canales, MD   Patient coming from: Home   I have personally briefly reviewed patient's old medical records in [place]  Chief Complaint: nausea, vomiting and diarrhea for one week.   HPI: Lynn Stone is a 68 y.o. female with medical history significant of asthma, esophagitis, GERD, hypertension, hypothyroidism, presents to ED with complaints of nausea, vomiting and diarrhea for over a week, not associated with abdominal pain. She denies any fevers , reports occasional chills. She reports having asthma and occasional dry cough. She denies sob, fevers or productive cough. She denies hematemesis, hematochezia, dysuria. She reports having occasional headaches, no blurry vision , no tingling or numbness in her extremities. She reports her sodium level has always been low and never worked up. She also reports having a long history of insomnia.   ED Course: On arrival to ED, she was found to be afebrile, RR of 23/min, BP of 159/97 mmhg, lab work revealed sodium of 115, potassium of 2.8, chloride of 79, creatinine of 0.62, magnesium of 1.9. wbc count of 15, glucose of 190, COVID 19 is pending, .  cxr does not show any pneumonia or acute findings.  EKG shows NSR with LVH.   PT referred to [facility] for admission for hyponatremia and hypokalemia and evaluation of diarrhea.   Review of Systems: As per HPI otherwise  All others reviewed and are negative.   Past Medical History:  Diagnosis Date  . Asthma   . GERD (gastroesophageal reflux disease)   . Hypertension   . Seasonal allergies   . Thyroid disease     Past Surgical History:  Procedure Laterality Date  . ABDOMINAL HYSTERECTOMY    . CESAREAN SECTION     x 3  . CHOLECYSTECTOMY     Social history:   reports that she has never smoked. She has never used smokeless tobacco. She reports that she does not drink alcohol or use  drugs.  Allergies  Allergen Reactions  . Eggs Or Egg-Derived Products   . Erythromycin     Family History  Problem Relation Age of Onset  . Heart disease Father    Family history reviewed and positive for insomnia.   Prior to Admission medications   Medication Sig Start Date End Date Taking? Authorizing Provider  albuterol (PROVENTIL HFA;VENTOLIN HFA) 108 (90 BASE) MCG/ACT inhaler Inhale into the lungs every 6 (six) hours as needed for wheezing or shortness of breath.    [provider]  albuterol (PROVENTIL HFA;VENTOLIN HFA) 108 (90 BASE) MCG/ACT inhaler Inhale 2 puffs into the lungs every 4 (four) hours as needed for wheezing or shortness of breath. 02/11/14   Jacqulyn Cane, MD  amoxicillin (AMOXIL) 250 MG/5ML suspension Take 10 mLs (500 mg total) by mouth 3 (three) times daily. For 10 days 02/12/14   Jacqulyn Cane, MD  amoxicillin (AMOXIL) 875 MG tablet Take 1 twice a day X 10 days. 02/11/14   Jacqulyn Cane, MD  esomeprazole (NEXIUM) 40 MG capsule Take 40 mg by mouth daily at 12 noon.    [provider]  fluticasone Asencion Islam) 50 MCG/ACT nasal spray 1 or 2 sprays each nostril twice a day 02/11/14   Jacqulyn Cane, MD  Fluticasone-Salmeterol (ADVAIR DISKUS) 250-50 MCG/DOSE AEPB Inhale 1 puff into the lungs 2 (two) times daily.    [provider]  levothyroxine (SYNTHROID, LEVOTHROID) 100 MCG tablet Take 100 mcg by mouth daily before  breakfast.    [provider]  predniSONE (DELTASONE) 20 MG tablet Take 1 tablet (20 mg total) by mouth 2 (two) times daily with a meal. X 5 days 02/11/14   Jacqulyn Cane, MD  predniSONE 5 MG/5ML solution Take 20 mLs (20 mg total) by mouth 2 (two) times daily. For 5 days. 02/12/14   Jacqulyn Cane, MD  ramipril (ALTACE) 10 MG capsule Take 10 mg by mouth daily.    [provider]    Physical Exam:  Constitutional: appears calm and comfortable.  Vitals:   03/17/19 1445 03/17/19 1500 03/17/19 1545 03/17/19 1610   BP: (!) 175/89 (!) 177/89 (!) 184/92 (!) 159/97  Pulse: 89 89 88 86  Resp: (!) 21 19 (!) 30 (!) 27  Temp:      TempSrc:      SpO2: 97% 96% 96% 97%   Eyes: PERRL, lids and conjunctivae normal ENMT: Mucous membranes are dry Neck: normal, supple,  Respiratory: clear to auscultation bilaterally, no wheezing, no crackles. Normal respiratory effort. No accessory muscle use.  Cardiovascular: Regular rate and rhythm, no murmurs / rubs / gallops.  Trace pedal edema.  Abdomen: no tenderness, abd is soft, non distended.  Bowel sounds positive.  Musculoskeletal: no clubbing / cyanosis.  Skin: no rashes, lesions, ulcers Neurologic: alert and oriented to place and person, able to  Move all extremities. Marland Kitchen  Psychiatric: . Alert and oriented x 3.  Anxious.     Labs on Admission: I have personally reviewed following labs and imaging studies  CBC: Recent Labs  Lab 03/17/19 1050  WBC 15.0*  HGB 13.6  HCT 38.2  MCV 80.8  PLT 99991111   Basic Metabolic Panel: Recent Labs  Lab 03/17/19 1050 03/17/19 1220  NA 115*  --   K 2.8*  --   CL 79*  --   CO2 22  --   GLUCOSE 190*  --   BUN 8  --   CREATININE 0.62  --   CALCIUM 9.6  --   MG  --  1.9   GFR: CrCl cannot be calculated (Unknown ideal weight.). Liver Function Tests: Recent Labs  Lab 03/17/19 1050  AST 32  ALT 26  ALKPHOS 74  BILITOT 0.8  PROT 7.5  ALBUMIN 4.7   Recent Labs  Lab 03/17/19 1050  LIPASE 21   No results for input(s): AMMONIA in the last 168 hours. Coagulation Profile: No results for input(s): INR, PROTIME in the last 168 hours. Cardiac Enzymes: No results for input(s): CKTOTAL, CKMB, CKMBINDEX, TROPONINI in the last 168 hours. BNP (last 3 results) No results for input(s): PROBNP in the last 8760 hours. HbA1C: No results for input(s): HGBA1C in the last 72 hours. CBG: No results for input(s): GLUCAP in the last 168 hours. Lipid Profile: No results for input(s): CHOL, HDL, LDLCALC, TRIG, CHOLHDL, LDLDIRECT  in the last 72 hours. Thyroid Function Tests: No results for input(s): TSH, T4TOTAL, FREET4, T3FREE, THYROIDAB in the last 72 hours. Anemia Panel: No results for input(s): VITAMINB12, FOLATE, FERRITIN, TIBC, IRON, RETICCTPCT in the last 72 hours. Urine analysis:    Component Value Date/Time   COLORURINE YELLOW 10/06/2009 1915   APPEARANCEUR CLEAR 10/06/2009 1915   LABSPEC 1.003 (L) 10/06/2009 1915   PHURINE 6.5 10/06/2009 1915   GLUCOSEU NEGATIVE 10/06/2009 1915   HGBUR NEGATIVE 10/06/2009 [place] 10/06/2009 [place] 10/06/2009 1915   PROTEINUR NEGATIVE 10/06/2009 1915   UROBILINOGEN 0.2 10/06/2009 1915  NITRITE NEGATIVE 10/06/2009 1915   LEUKOCYTESUR  10/06/2009 1915    NEGATIVE MICROSCOPIC NOT DONE ON URINES WITH NEGATIVE PROTEIN, BLOOD, LEUKOCYTES, NITRITE, OR GLUCOSE <1000 mg/dL.    Radiological Exams on Admission: DG Chest Port 1 View  Result Date: 03/17/2019 CLINICAL DATA:  Chest pain EXAM: PORTABLE CHEST 1 VIEW COMPARISON:  10/06/2009 FINDINGS: Heart size upper normal. Negative for heart failure. Lungs well aerated without infiltrate effusion or mass. No acute skeletal abnormality. IMPRESSION: No active disease. Electronically Signed   By: Franchot Gallo M.D.   On: 03/17/2019 13:22    EKG: Independently reviewed.NSR with prolonged QT.   Assessment/Plan Active Problems:   Hyponatremia    Persistent nausea, vomiting and diarrhea:  Differential diagnosis infectious gastroenteritis vs gastritis due to h/o of esophagitis and GERD vs gastroparesis. Clear liquid diet,  GI pathogen for PCR ordered .  IV fluids and IV PPI.  If no improvement please call GI for evaluation with EGD.     Acute Hyponatremia:  -suspect secondary to dehydration from persistent nausea, vomiting and diarrhea.  - gently hydrate and repeat sodium every 6 hours.  - it appears she has chronic hyponatremia. Baseline around 120's.  Get TSH, serum osmo, urine  osmo, urine sodium  And am cortisol.    Hypokalemia: replaced and repeat in am.    Hypertension: suboptimally controlled.  Added hydralazine prn.    Diabetes mellitus type 2 Start her on SSI and get hemoglobin A1c.   H/o prolonged headaches without relief:  ? Migraine , get CT head for further evaluation.    Prolonged Qt interval:  Probably sec to hypokalemia , replace and recheck EKG in am.    Asthma:  No wheezing heard, resume home alb .    Insomnia for many years:  Pt requested to speak to a psychiatrist . Consult placed.    Leukocytosis:  ? Infectious. Repeat CBC with differential in am.    DVT prophylaxis: LOVENOX.  Code Status: full code.  Family Communication: none at bedside.  Disposition Plan: pending improvement in the sodium and potassium.  Consults called: psychiatry  Admission status: obs/progressive.    Hosie Poisson MD [facility]  03/17/2019, 5:19 PM

## 2019-03-17 NOTE — ED Notes (Signed)
Paged MD Baltazar Najjar to notify about critical sodium level 116, waiting for further orders.

## 2019-03-17 NOTE — ED Provider Notes (Signed)
[facility] EMERGENCY DEPARTMENT Provider Note   CSN: [id number] Arrival date & time: 03/17/19  1033     History Chief Complaint  Patient presents with  . Nausea  . Emesis    Lynn Stone is a 68 y.o. female.  HPI   Lynn Stone presents for evaluation of diarrhea and vomiting starting 3 days ago.  No known sick contacts.  Lynn Stone denies fever.  Lynn Stone states Lynn Stone has a "dry cough."  Lynn Stone has not been able to eat anything for 2 days.  Lynn Stone took a Nexium this morning.  Lynn Stone states that Lynn Stone has persistent vomiting, now for 2 days.  Lynn Stone denies blood in emesis.  Lynn Stone states that several years ago Lynn Stone had a similar problem to this and it was caused by esophagitis.  Lynn Stone states Lynn Stone is able to walk today, and came here by private vehicle.  Lynn Stone states Lynn Stone feels somewhat dizzy with walking at this time.  There are no other known modifying factors.  Past Medical History:  Diagnosis Date  . Asthma   . GERD (gastroesophageal reflux disease)   . Hypertension   . Seasonal allergies   . Thyroid disease     Patient Active Problem List   Diagnosis Date Noted  . CANDIDIASIS OF MOUTH 10/12/2009  . HYPERLIPIDEMIA 10/12/2009  . MIGRAINE HEADACHE 10/12/2009  . ALLERGIC RHINITIS 10/12/2009  . ESOPHAGITIS 10/12/2009  . COLONIC POLYPS, HX OF 10/12/2009  . UTI'S, HX OF 10/12/2009  . HYPOTHYROIDISM 10/11/2009  . DIABETES MELLITUS, TYPE II 10/11/2009  . HYPERTENSION 10/11/2009  . ASTHMA 10/11/2009  . IRRITABLE BOWEL SYNDROME 10/11/2009  . NAUSEA WITH VOMITING 10/07/2009  . EPIGASTRIC PAIN 10/07/2009    Past Surgical History:  Procedure Laterality Date  . ABDOMINAL HYSTERECTOMY    . CESAREAN SECTION     x 3  . CHOLECYSTECTOMY       OB History   No obstetric history on file.     Family History  Problem Relation Age of Onset  . Heart disease Father     Social History   Tobacco Use  . Smoking status: Never Smoker  . Smokeless tobacco: Never Used  Substance Use Topics  . Alcohol  use: No  . Drug use: No    Home Medications Prior to Admission medications   Medication Sig Start Date End Date Taking? Authorizing Provider  albuterol (PROVENTIL HFA;VENTOLIN HFA) 108 (90 BASE) MCG/ACT inhaler Inhale into the lungs every 6 (six) hours as needed for wheezing or shortness of breath.    [provider]  albuterol (PROVENTIL HFA;VENTOLIN HFA) 108 (90 BASE) MCG/ACT inhaler Inhale 2 puffs into the lungs every 4 (four) hours as needed for wheezing or shortness of breath. 02/11/14   Jacqulyn Cane, MD  amoxicillin (AMOXIL) 250 MG/5ML suspension Take 10 mLs (500 mg total) by mouth 3 (three) times daily. For 10 days 02/12/14   Jacqulyn Cane, MD  amoxicillin (AMOXIL) 875 MG tablet Take 1 twice a day X 10 days. 02/11/14   Jacqulyn Cane, MD  esomeprazole (NEXIUM) 40 MG capsule Take 40 mg by mouth daily at 12 noon.    [provider]  fluticasone Asencion Islam) 50 MCG/ACT nasal spray 1 or 2 sprays each nostril twice a day 02/11/14   Jacqulyn Cane, MD  Fluticasone-Salmeterol (ADVAIR DISKUS) 250-50 MCG/DOSE AEPB Inhale 1 puff into the lungs 2 (two) times daily.    [provider]  levothyroxine (SYNTHROID, LEVOTHROID) 100 MCG tablet Take 100 mcg by mouth daily before breakfast.  [provider]  predniSONE (DELTASONE) 20 MG tablet Take 1 tablet (20 mg total) by mouth 2 (two) times daily with a meal. X 5 days 02/11/14   Jacqulyn Cane, MD  predniSONE 5 MG/5ML solution Take 20 mLs (20 mg total) by mouth 2 (two) times daily. For 5 days. 02/12/14   Jacqulyn Cane, MD  ramipril (ALTACE) 10 MG capsule Take 10 mg by mouth daily.    [provider]    Allergies    Eggs or egg-derived products and Erythromycin  Review of Systems   Review of Systems  All other systems reviewed and are negative.   Physical Exam Updated Vital Signs BP (!) 175/89   Pulse 89   Temp 98 F (36.7 C) (Oral)   Resp (!) 21   SpO2 97%     Physical Exam Vitals and nursing  note reviewed.  Constitutional:      General: Lynn Stone is not in acute distress.    Appearance: Normal appearance. Lynn Stone is well-developed. Lynn Stone is obese. Lynn Stone is not ill-appearing, toxic-appearing or diaphoretic.  HENT:     Head: Normocephalic and atraumatic.     Right Ear: External ear normal.     Left Ear: External ear normal.     Nose: No congestion.     Mouth/Throat:     Mouth: Mucous membranes are dry.     Pharynx: No oropharyngeal exudate or posterior oropharyngeal erythema.  Eyes:     Conjunctiva/sclera: Conjunctivae normal.     Pupils: Pupils are equal, round, and reactive to light.  Neck:     Trachea: Phonation normal.  Cardiovascular:     Rate and Rhythm: Normal rate and regular rhythm.  Pulmonary:     Effort: Pulmonary effort is normal.     Comments: Tachypneic Abdominal:     General: There is no distension.     Palpations: Abdomen is soft. There is no mass.     Tenderness: There is abdominal tenderness (Diffuse, mild). There is no guarding.     Hernia: No hernia is present.  Musculoskeletal:        General: Swelling present. Normal range of motion.     Cervical back: Normal range of motion and neck supple.     Right lower leg: Edema present.     Left lower leg: Edema present.  Skin:    General: Skin is warm and dry.  Neurological:     Mental Status: Lynn Stone is alert and oriented to person, place, and time.     Cranial Nerves: No cranial nerve deficit.     Sensory: No sensory deficit.     Motor: No abnormal muscle tone.     Coordination: Coordination normal.  Psychiatric:        Mood and Affect: Mood normal.        Behavior: Behavior normal.        Thought Content: Thought content normal.        Judgment: Judgment normal.     Comments: Anxious     ED Results / Procedures / Treatments   Labs (all labs ordered are listed, but only abnormal results are displayed) Labs Reviewed  COMPREHENSIVE METABOLIC PANEL - Abnormal; Notable for the following components:       Result Value   Sodium 115 (*)    Potassium 2.8 (*)    Chloride 79 (*)    Glucose, Bld 190 (*)    All other components within normal limits  CBC - Abnormal; Notable for the following  components:   WBC 15.0 (*)    All other components within normal limits  SARS CORONAVIRUS 2 (TAT 6-24 HRS)  LIPASE, BLOOD  MAGNESIUM  URINALYSIS, ROUTINE W REFLEX MICROSCOPIC  POC SARS CORONAVIRUS 2 AG -  ED    EKG EKG Interpretation  Date/Time:  Monday March 17 2019 10:47:03 EST Ventricular Rate:  86 PR Interval:  190 QRS Duration: 104 QT Interval:  412 QTC Calculation: 493 R Axis:   -18 Text Interpretation: Normal sinus rhythm Moderate voltage criteria for LVH, may be normal variant ( R in aVL , Cornell product ) Prolonged QT Abnormal ECG Since last tracing QT has lengthened and LVH is new Confirmed by Daleen Bo [id number]) on 03/17/2019 11:11:41 AM   Radiology DG Chest Port 1 View  Result Date: 03/17/2019 CLINICAL DATA:  Chest pain EXAM: PORTABLE CHEST 1 VIEW COMPARISON:  10/06/2009 FINDINGS: Heart size upper normal. Negative for heart failure. Lungs well aerated without infiltrate effusion or mass. No acute skeletal abnormality. IMPRESSION: No active disease. Electronically Signed   By: Franchot Gallo M.D.   On: 03/17/2019 13:22    Procedures .Critical Care Performed by: Daleen Bo, MD Authorized by: Daleen Bo, MD   Critical care provider statement:    Critical care time (minutes):  35   Critical care start time:  03/17/2019 11:00 AM   Critical care end time:  03/17/2019 3:55 PM   Critical care time was exclusive of:  Separately billable procedures and treating other patients   Critical care was necessary to treat or prevent imminent or life-threatening deterioration of the following conditions:  Metabolic crisis   Critical care was time spent personally by me on the following activities:  Blood draw for specimens, development of treatment plan with patient or surrogate,  discussions with consultants, evaluation of patient's response to treatment, examination of patient, obtaining history from patient or surrogate, ordering and performing treatments and interventions, ordering and review of laboratory studies, pulse oximetry, re-evaluation of patient's condition, review of old charts and ordering and review of radiographic studies   (including critical care time)  Medications Ordered in ED Medications  sodium chloride flush (NS) 0.9 % injection 3 mL (has no administration in time range)  0.9 %  sodium chloride infusion ( Intravenous New Bag/Given 03/17/19 1444)  potassium chloride 10 mEq in 100 mL IVPB (10 mEq Intravenous New Bag/Given 03/17/19 1446)  sodium chloride 0.9 % bolus 500 mL (0 mLs Intravenous Stopped 03/17/19 1442)  sodium chloride 0.9 % bolus 500 mL (0 mLs Intravenous Stopped 03/17/19 1442)  magnesium sulfate IVPB 2 g 50 mL (0 g Intravenous Stopped 03/17/19 1442)    ED Course  I have reviewed the triage vital signs and the nursing notes.  Pertinent labs & imaging results that were available during my care of the patient were reviewed by me and considered in my medical decision making (see chart for details).  Clinical Course as of Mar 17 1543  Mon Mar 17, 2019  1218 Normal  POC SARS Coronavirus 2 Ag-ED - Nasal Swab (BD Veritor Kit) [EW]  1218 Normal  Lipase, blood [EW]  1218 Normal except white count high  CBC(!) [EW]  1218 Normal except sodium low, potassium low, chloride low, glucose high  Comprehensive metabolic panel(!!) [EW]  8242 Normal  Magnesium [EW]  1540 No CHF or infiltrate, image interpreted by me  DG Chest [facility] 1 View [EW]    Clinical Course User Index [EW] Daleen Bo, MD   MDM Rules/Calculators/A&P  Patient Vitals for the past 24 hrs:  BP Temp Temp src Pulse Resp SpO2  03/17/19 1445 (!) 175/89 -- -- 89 (!) 21 97 %  03/17/19 1356 (!) 183/97 98 F (36.7 C) Oral 87 (!) 29 97 %  03/17/19  1245 (!) 194/92 -- -- 86 (!) 22 94 %  03/17/19 1200 (!) 190/95 -- -- 83 (!) 24 97 %  03/17/19 1145 (!) 194/90 -- -- 78 20 95 %  03/17/19 1130 (!) 194/99 -- -- 80 (!) 25 99 %  03/17/19 1115 (!) 200/94 -- -- 80 (!) 29 96 %  03/17/19 1107 (!) 194/94 -- -- 83 17 97 %  03/17/19 1039 (!) 175/95 (!) 97.5 F (36.4 C) Oral 86 18 97 %    3:43 PM Reevaluation with update and discussion. After initial assessment and treatment, an updated evaluation reveals no change in clinical status, findings discussed with the patient and all questions were answered. Daleen Bo   Medical Decision Making: Patient with vomiting and diarrhea, findings of significant hyponatremia and hypokalemia exam.  No clear etiology for vomiting.  Diarrhea has resolved spontaneously.  Suspect chest pain is secondary to vomiting, possibly esophagitis.  Lynn Stone will require hospitalization for stabilization.  Treatment begun in the emergency department.  Doubt ACS, PE or pneumonia.  Hemodynamically stable with mild hypertension.  Nonspecific tachypnea, at disposition, unclear cause.  Covid test ordered.  No specific Covid symptoms.    Lynn Stone was evaluated in Emergency Department on 03/17/2019 for the symptoms described in the history of present illness. Lynn Stone was evaluated in the context of the global COVID-19 pandemic, which necessitated consideration that the patient might be at risk for infection with the SARS-CoV-2 virus that causes COVID-19. Institutional protocols and algorithms that pertain to the evaluation of patients at risk for COVID-19 are in a state of rapid change based on information released by regulatory bodies including the CDC and federal and state organizations. These policies and algorithms were followed during the patient's care in the ED.  CRITICAL CARE- Yes Performed by: Daleen Bo  Nursing Notes Reviewed/ Care Coordinated Applicable Imaging Reviewed Interpretation of Laboratory Data incorporated into ED  treatment  3:44 PM-Consult complete with hospitalist. Patient case explained and discussed.  Medical screening examination/treatment/procedure(s) were conducted as a shared visit with non-physician practitioner(s) and myself.  I personally evaluated the patient during the encounter agrees to admit patient for further evaluation and treatment. Call ended at 4:28 PM  Plan: Admit  Final Clinical Impression(s) / ED Diagnoses Final diagnoses:  Nausea and vomiting, intractability of vomiting not specified, unspecified vomiting type  Hyponatremia  Hypokalemia  Hypertension, unspecified type    Rx / DC Orders ED Discharge Orders    None       Daleen Bo, MD 03/17/19 1630

## 2019-03-17 NOTE — ED Triage Notes (Signed)
Pt presents with epigastric pain and vomiting x2 days, she reports a history of esophagitis and believes this is the same. EKG being completed. NAD at triage.

## 2019-03-17 NOTE — ED Notes (Signed)
1st NS bolus still infusing, placed on pump.

## 2019-03-17 NOTE — ED Notes (Signed)
Patient transported to CT 

## 2019-03-18 DIAGNOSIS — D72829 Elevated white blood cell count, unspecified: Secondary | ICD-10-CM | POA: Diagnosis present

## 2019-03-18 DIAGNOSIS — R112 Nausea with vomiting, unspecified: Secondary | ICD-10-CM | POA: Diagnosis not present

## 2019-03-18 DIAGNOSIS — K2100 Gastro-esophageal reflux disease with esophagitis, without bleeding: Secondary | ICD-10-CM | POA: Diagnosis present

## 2019-03-18 DIAGNOSIS — Z8249 Family history of ischemic heart disease and other diseases of the circulatory system: Secondary | ICD-10-CM | POA: Diagnosis not present

## 2019-03-18 DIAGNOSIS — J302 Other seasonal allergic rhinitis: Secondary | ICD-10-CM | POA: Diagnosis present

## 2019-03-18 DIAGNOSIS — E876 Hypokalemia: Secondary | ICD-10-CM | POA: Diagnosis present

## 2019-03-18 DIAGNOSIS — E039 Hypothyroidism, unspecified: Secondary | ICD-10-CM | POA: Diagnosis present

## 2019-03-18 DIAGNOSIS — Z9049 Acquired absence of other specified parts of digestive tract: Secondary | ICD-10-CM | POA: Diagnosis not present

## 2019-03-18 DIAGNOSIS — Z90710 Acquired absence of both cervix and uterus: Secondary | ICD-10-CM | POA: Diagnosis not present

## 2019-03-18 DIAGNOSIS — E119 Type 2 diabetes mellitus without complications: Secondary | ICD-10-CM | POA: Diagnosis present

## 2019-03-18 DIAGNOSIS — Z79899 Other long term (current) drug therapy: Secondary | ICD-10-CM | POA: Diagnosis not present

## 2019-03-18 DIAGNOSIS — E871 Hypo-osmolality and hyponatremia: Secondary | ICD-10-CM | POA: Diagnosis present

## 2019-03-18 DIAGNOSIS — K589 Irritable bowel syndrome without diarrhea: Secondary | ICD-10-CM | POA: Diagnosis present

## 2019-03-18 DIAGNOSIS — Z91012 Allergy to eggs: Secondary | ICD-10-CM | POA: Diagnosis not present

## 2019-03-18 DIAGNOSIS — G4700 Insomnia, unspecified: Secondary | ICD-10-CM | POA: Diagnosis present

## 2019-03-18 DIAGNOSIS — I10 Essential (primary) hypertension: Secondary | ICD-10-CM | POA: Diagnosis present

## 2019-03-18 DIAGNOSIS — E785 Hyperlipidemia, unspecified: Secondary | ICD-10-CM | POA: Diagnosis present

## 2019-03-18 DIAGNOSIS — R197 Diarrhea, unspecified: Secondary | ICD-10-CM | POA: Diagnosis present

## 2019-03-18 DIAGNOSIS — Z20828 Contact with and (suspected) exposure to other viral communicable diseases: Secondary | ICD-10-CM | POA: Diagnosis present

## 2019-03-18 DIAGNOSIS — J45909 Unspecified asthma, uncomplicated: Secondary | ICD-10-CM | POA: Diagnosis not present

## 2019-03-18 DIAGNOSIS — Z881 Allergy status to other antibiotic agents status: Secondary | ICD-10-CM | POA: Diagnosis not present

## 2019-03-18 DIAGNOSIS — G43A0 Cyclical vomiting, not intractable: Secondary | ICD-10-CM | POA: Diagnosis present

## 2019-03-18 DIAGNOSIS — Z79890 Hormone replacement therapy (postmenopausal): Secondary | ICD-10-CM | POA: Diagnosis not present

## 2019-03-18 DIAGNOSIS — E222 Syndrome of inappropriate secretion of antidiuretic hormone: Secondary | ICD-10-CM | POA: Diagnosis present

## 2019-03-18 LAB — SODIUM
Sodium: 118 mmol/L — CL (ref 135–145)
Sodium: 119 mmol/L — CL (ref 135–145)

## 2019-03-18 LAB — BASIC METABOLIC PANEL
Anion gap: 10 (ref 5–15)
Anion gap: 9 (ref 5–15)
BUN: 6 mg/dL — ABNORMAL LOW (ref 8–23)
BUN: 7 mg/dL — ABNORMAL LOW (ref 8–23)
CO2: 23 mmol/L (ref 22–32)
CO2: 22 mmol/L (ref 22–32)
Calcium: 8.5 mg/dL — ABNORMAL LOW (ref 8.9–10.3)
Calcium: 8.6 mg/dL — ABNORMAL LOW (ref 8.9–10.3)
Chloride: 85 mmol/L — ABNORMAL LOW (ref 98–111)
Chloride: 85 mmol/L — ABNORMAL LOW (ref 98–111)
Creatinine, Ser: 0.64 mg/dL (ref 0.44–1.00)
Creatinine, Ser: 0.67 mg/dL (ref 0.44–1.00)
GFR calc Af Amer: >60 mL/min (ref >60)
GFR calc Af Amer: >60 mL/min (ref >60)
GFR calc non Af Amer: >60 mL/min (ref >60)
GFR calc non Af Amer: >60 mL/min (ref >60)
Glucose, Bld: 162 mg/dL — ABNORMAL HIGH (ref 70–99)
Glucose, Bld: 114 mg/dL — ABNORMAL HIGH (ref 70–99)
Potassium: 2.7 mmol/L — CL (ref 3.5–5.1)
Potassium: 3.6 mmol/L (ref 3.5–5.1)
Sodium: 118 mmol/L — CL (ref 135–145)
Sodium: 116 mmol/L — CL (ref 135–145)

## 2019-03-18 LAB — GLUCOSE, CAPILLARY
Glucose-Capillary: 121 mg/dL — ABNORMAL HIGH (ref 70–99)
Glucose-Capillary: 112 mg/dL — ABNORMAL HIGH (ref 70–99)
Glucose-Capillary: 102 mg/dL — ABNORMAL HIGH (ref 70–99)
Glucose-Capillary: 122 mg/dL — ABNORMAL HIGH (ref 70–99)

## 2019-03-18 LAB — CBC
HCT: 36.7 % (ref 36.0–46.0)
Hemoglobin: 12.9 g/dL (ref 12.0–15.0)
MCH: 28.4 pg (ref 26.0–34.0)
MCHC: 35.1 g/dL (ref 30.0–36.0)
MCV: 80.7 fL (ref 80.0–100.0)
Platelets: 225 10*3/uL (ref 150–400)
RBC: 4.55 MIL/uL (ref 3.87–5.11)
RDW: 12.7 % (ref 11.5–15.5)
WBC: 14.4 10*3/uL — ABNORMAL HIGH (ref 4.0–10.5)
nRBC: 0 % (ref 0.0–0.2)

## 2019-03-18 LAB — CORTISOL: Cortisol, Plasma: 22.7 ug/dL

## 2019-03-18 LAB — HIV ANTIBODY (ROUTINE TESTING W REFLEX): HIV Screen 4th Generation wRfx: NONREACTIVE

## 2019-03-18 LAB — TSH: TSH: 1.35 u[IU]/mL (ref 0.350–4.500)

## 2019-03-18 LAB — OSMOLALITY: Osmolality: 245 mOsm/kg — CL (ref 275–295)

## 2019-03-18 MED ORDER — FUROSEMIDE 20 MG PO TABS
20.0000 mg | ORAL_TABLET | Freq: Once | ORAL | Status: AC
  Administered 2019-03-18: 20 mg via ORAL
  Filled 2019-03-18: qty 1

## 2019-03-18 MED ORDER — MOMETASONE FURO-FORMOTEROL FUM 200-5 MCG/ACT IN AERO
2.0000 | INHALATION_SPRAY | Freq: Two times a day (BID) | RESPIRATORY_TRACT | Status: DC
  Administered 2019-03-18 – 2019-03-20 (×5): 2 via RESPIRATORY_TRACT
  Filled 2019-03-18: qty 8.8

## 2019-03-18 MED ORDER — ENOXAPARIN SODIUM 40 MG/0.4ML ~~LOC~~ SOLN
40.0000 mg | Freq: Every day | SUBCUTANEOUS | Status: DC
  Administered 2019-03-18 – 2019-03-19 (×2): 40 mg via SUBCUTANEOUS
  Filled 2019-03-18 (×2): qty 0.4

## 2019-03-18 MED ORDER — SODIUM CHLORIDE 1 G PO TABS
1.0000 g | ORAL_TABLET | Freq: Three times a day (TID) | ORAL | Status: DC
  Administered 2019-03-18: 1 g via ORAL
  Filled 2019-03-18 (×2): qty 1

## 2019-03-18 MED ORDER — POTASSIUM CHLORIDE CRYS ER 20 MEQ PO TBCR
40.0000 meq | EXTENDED_RELEASE_TABLET | Freq: Two times a day (BID) | ORAL | Status: DC
  Administered 2019-03-18 (×2): 40 meq via ORAL
  Filled 2019-03-18 (×3): qty 2

## 2019-03-18 MED ORDER — SODIUM CHLORIDE 1 G PO TABS
2.0000 g | ORAL_TABLET | Freq: Three times a day (TID) | ORAL | Status: DC
  Filled 2019-03-18 (×2): qty 2

## 2019-03-18 MED ORDER — ALBUTEROL SULFATE (2.5 MG/3ML) 0.083% IN NEBU: 2.5000 mg | INHALATION_SOLUTION | Freq: Four times a day (QID) | RESPIRATORY_TRACT | Status: DC | PRN

## 2019-03-18 MED ORDER — POTASSIUM CHLORIDE 10 MEQ/100ML IV SOLN
10.0000 meq | INTRAVENOUS | Status: AC
  Administered 2019-03-18 (×3): 10 meq via INTRAVENOUS
  Filled 2019-03-18 (×3): qty 100

## 2019-03-18 MED ORDER — LEVOTHYROXINE SODIUM 100 MCG PO TABS
100.0000 ug | ORAL_TABLET | Freq: Every day | ORAL | Status: DC
  Administered 2019-03-18 – 2019-03-20 (×3): 100 ug via ORAL
  Filled 2019-03-18 (×3): qty 1

## 2019-03-18 MED ORDER — MIRTAZAPINE 15 MG PO TABS
7.5000 mg | ORAL_TABLET | Freq: Every day | ORAL | Status: DC
  Administered 2019-03-18 – 2019-03-19 (×2): 7.5 mg via ORAL
  Filled 2019-03-18 (×2): qty 1

## 2019-03-18 NOTE — Progress Notes (Signed)
Insomnia recommendation:  Remeron 7.5 mg daily at bedtime (lower doses hit the histamine receptor, promoting sleep).    Waylan Boga, PMHNP

## 2019-03-18 NOTE — Progress Notes (Signed)
Lab called with critical value, sodium 116. Provider notified.

## 2019-03-18 NOTE — Progress Notes (Addendum)
PROGRESS NOTE    Lynn Stone  [id number] DOB: 01/12/1951 DOA: 03/17/2019 PCP: Brett Canales, MD   Brief Narrative:  Lynn Stone is a 68 y.o. female with medical history significant of asthma, esophagitis, GERD, hypertension, hypothyroidism, presents to ED with complaints of nausea, vomiting and diarrhea for over a week,. She was found to have low sodium of 115 and potassium of 2.8.  She was admitted for evaluation of hyponatremia and diarrhea.   Assessment & Plan:   Active Problems:   Hypothyroidism   Migraine headache   Essential hypertension   Asthma   Esophagitis   Nausea with vomiting   Hyponatremia  Nausea, vomiting and diarrhea:  differential include gastritis vs gastroenteritis.  Symptoms improved with IV zofran and IV PPI.  Advance diet as tolerated.  Continue with gentle hydration.     Hyponatremia: slight improvement in sodium from 115 to 119 after IV fluids.  ? Dehydration vs SIADH.  Low serum osmolality 245 , urine osmo is 357,  Urine sodium is 64, TSH is wnl. Cortisol is 22.  Nephrology consulted for further recommendations.   Hypothyroidism  Resume synthroid.    Asthma:  No wheezing heard, resume albuterol.   Insomnia: Psychiatry recommending Remeron.    Essential hypertension: BP parameters have improved when compared to yesterday.    Hypokalemia:  Replaced, repeat potassium pending.    DVT prophylaxis: Lovenox.  Code Status: full code.  Family Communication: none at bedside.  Disposition Plan: pending improvement in the sodium.   Consultants:   nephrology  Procedures: None Antimicrobials: none  Subjective: Reports feeling better. No diarrhea , no nausea or vomiting.  No abdominal pain, pt reports her symptoms improved after IV protonix.   Objective: Vitals:   03/18/19 0057 03/18/19 0423 03/18/19 0504 03/18/19 0842  BP: (!) 172/83 (!) 164/96  (!) 147/84  Pulse: 90 91  91  Resp: 20 20  18   Temp: 98.9 F (37.2 C) 99 F  (37.2 C)  97.9 F (36.6 C)  TempSrc: Oral Oral  Oral  SpO2: 96% 96%  97%  Weight:   83 kg     Intake/Output Summary (Last 24 hours) at 03/18/2019 0850 Last data filed at 03/18/2019 0705 Gross per 24 hour  Intake 720 ml  Output 901 ml  Net -181 ml   Filed Weights   03/17/19 2222 03/18/19 0504  Weight: 83.4 kg 83 kg    Examination:  General exam: Appears calm and comfortable  Respiratory system: Clear to auscultation. Respiratory effort normal. Cardiovascular system: S1 & S2 heard, RRR.  Gastrointestinal system: Abdomen is nondistended, soft and nontender. Normal bowel sounds heard. Central nervous system: Alert and oriented. No focal neurological deficits. Extremities: trace pedal edema. Skin: No rashes, lesions or ulcers Psychiatry:  Mood & affect appropriate.     Data Reviewed: I have personally reviewed following labs and imaging studies  CBC: Recent Labs  Lab 03/17/19 1050  WBC 15.0*  HGB 13.6  HCT 38.2  MCV 80.8  PLT [id number]   Basic Metabolic Panel: Recent Labs  Lab 03/17/19 1050 03/17/19 1220 03/17/19 1922 03/17/19 2355 03/18/19 0528  NA 115*  --  116* 118* 119*  K 2.8*  --   --   --   --   CL 79*  --   --   --   --   CO2 22  --   --   --   --   GLUCOSE 190*  --   --   --   --  BUN 8  --   --   --   --   CREATININE 0.62  --   --   --   --   CALCIUM 9.6  --   --   --   --   MG  --  1.9  --   --   --    GFR: CrCl cannot be calculated (Unknown ideal weight.). Liver Function Tests: Recent Labs  Lab 03/17/19 1050  AST 32  ALT 26  ALKPHOS 74  BILITOT 0.8  PROT 7.5  ALBUMIN 4.7   Recent Labs  Lab 03/17/19 1050  LIPASE 21   No results for input(s): AMMONIA in the last 168 hours. Coagulation Profile: No results for input(s): INR, PROTIME in the last 168 hours. Cardiac Enzymes: No results for input(s): CKTOTAL, CKMB, CKMBINDEX, TROPONINI in the last 168 hours. BNP (last 3 results) No results for input(s): PROBNP in the last 8760 hours.  HbA1C: Recent Labs    03/17/19 1816  HGBA1C 6.0*   CBG: Recent Labs  Lab 03/17/19 2227 03/18/19 0646  GLUCAP 141* 121*   Lipid Profile: No results for input(s): CHOL, HDL, LDLCALC, TRIG, CHOLHDL, LDLDIRECT in the last 72 hours. Thyroid Function Tests: Recent Labs    03/17/19 2355  TSH 1.350   Anemia Panel: No results for input(s): VITAMINB12, FOLATE, FERRITIN, TIBC, IRON, RETICCTPCT in the last 72 hours. Sepsis Labs: No results for input(s): PROCALCITON, LATICACIDVEN in the last 168 hours.  Recent Results (from the past 240 hour(s))  SARS CORONAVIRUS 2 (TAT 6-24 HRS) Nasopharyngeal Nasopharyngeal Swab     Status: None   Collection Time: 03/17/19  1:37 PM   Specimen: Nasopharyngeal Swab  Result Value Ref Range Status   SARS Coronavirus 2 NEGATIVE NEGATIVE Final    Comment: (NOTE) SARS-CoV-2 target nucleic acids are NOT DETECTED. The SARS-CoV-2 RNA is generally detectable in upper and lower respiratory specimens during the acute phase of infection. Negative results do not preclude SARS-CoV-2 infection, do not rule out co-infections with other pathogens, and should not be used as the sole basis for treatment or other patient management decisions. Negative results must be combined with clinical observations, patient history, and epidemiological information. The expected result is Negative. Fact Sheet for Patients: SugarRoll.be Fact Sheet for Healthcare Providers: https://www.woods-mathews.com/ This test is not yet approved or cleared by the [place] FDA and  has been authorized for detection and/or diagnosis of SARS-CoV-2 by FDA under an Emergency Use Authorization (EUA). This EUA will remain  in effect (meaning this test can be used) for the duration of the COVID-19 declaration under Section 56 4(b)(1) of the Act, 21 U.S.C. section 360bbb-3(b)(1), unless the authorization is terminated or revoked sooner. Performed at  [facility], [place] [address] [place], [place] [address]          Radiology Studies: CT HEAD WO CONTRAST  Result Date: 03/17/2019 CLINICAL DATA:  68 year old female with history of acute onset of a headache. Epigastric pain with vomiting for the past 2 days. EXAM: CT HEAD WITHOUT CONTRAST TECHNIQUE: Contiguous axial images were obtained from the base of the skull through the vertex without intravenous contrast. COMPARISON:  No priors. FINDINGS: Brain: Patchy areas of decreased attenuation are noted throughout the deep and periventricular white matter of the cerebral hemispheres bilaterally, compatible with mild chronic microvascular ischemic disease. No evidence of acute infarction, hemorrhage, hydrocephalus, extra-axial collection or mass lesion/mass effect. Vascular: No hyperdense vessel or unexpected calcification. Skull: Normal. Negative for fracture or  focal lesion. Sinuses/Orbits: No acute finding. Other: None. IMPRESSION: 1. No acute intracranial abnormalities. 2. Mild chronic microvascular ischemic changes in the cerebral white matter. Electronically Signed   By: Vinnie Langton M.D.   On: 03/17/2019 19:52   DG Chest Port 1 View  Result Date: 03/17/2019 CLINICAL DATA:  Chest pain EXAM: PORTABLE CHEST 1 VIEW COMPARISON:  10/06/2009 FINDINGS: Heart size upper normal. Negative for heart failure. Lungs well aerated without infiltrate effusion or mass. No acute skeletal abnormality. IMPRESSION: No active disease. Electronically Signed   By: Franchot Gallo M.D.   On: 03/17/2019 13:22        Scheduled Meds: . enoxaparin (LOVENOX) injection  40 mg Subcutaneous Daily  . levothyroxine  100 mcg Oral QAC breakfast  . mirtazapine  7.5 mg Oral QHS  . mometasone-formoterol  2 puff Inhalation BID  . pantoprazole (PROTONIX) IV  40 mg Intravenous Q24H  . sodium chloride flush  3 mL Intravenous Once   Continuous Infusions: . sodium chloride 75 mL/hr at 03/18/19 0827     LOS: 0 days         Hosie Poisson, MD [facility] 03/18/2019, 8:50 AM

## 2019-03-18 NOTE — Evaluation (Signed)
Physical Therapy Evaluation Patient Details Name: Lynn Stone MRN: [id number] DOB: 1951-02-22 Today's Date: 03/18/2019   History of Present Illness  68 yo female with onset of N&V, diarrhea at home was admitted, has been cleared by CT of head and chest xray.  Has prolonged QT interval, leukocytosis, hyponatremia.  PMHx:  insomnia, hypothyriodism, migraines, esophagitis, asthma, HTN, DM,   Clinical Impression  Pt was seen for initial mobility using vitals cart, and was able to demonstrate stable pulses, sats and no symptoms related to her condition and labs.  Pt is expecting to have home therapy, and talked with her about [facility] being her previous assistive device.  Follow up with her acutely for strength and balance, and work on endurance as her enteric precautions permit.  Pt is using purwick and talked with her nurse about the replacement of the device after therapy and trip to [place].  Follow up as needed with home therapy, and assess the [facility] being appropriate during acute stay.    Follow Up Recommendations Home health PT;Supervision for mobility/OOB    Equipment Recommendations  None recommended by PT    Recommendations for Other Services       Precautions / Restrictions Precautions Precautions: Fall Precaution Comments: monitor telemetry Restrictions Weight Bearing Restrictions: No      Mobility  Bed Mobility Overal bed mobility: Needs Assistance Bed Mobility: Supine to Sit;Sit to Supine     Supine to sit: Min assist Sit to supine: Min assist   General bed mobility comments: minor help to get up from bed with bedrail used  Transfers Overall transfer level: Needs assistance Equipment used: 1 person hand held assist Transfers: Sit to/from Stand Sit to Stand: Supervision         General transfer comment: supervision for bedside and in [place]  Ambulation/Gait Ambulation/Gait assistance: Min guard Gait Distance (Feet): 70 Feet(35 x 2) Assistive device: 1 person hand held  assist;IV Pole Gait Pattern/deviations: Step-through pattern;Decreased stride length;Shuffle Gait velocity: reduced   General Gait Details: slow Engineer, building services    Modified Rankin (Stroke Patients Only)       Balance Overall balance assessment: Needs assistance Sitting-balance support: Feet supported Sitting balance-Leahy Scale: Good   Postural control: Posterior lean Standing balance support: Single extremity supported Standing balance-Leahy Scale: Fair                               Pertinent Vitals/Pain Pain Assessment: No/denies pain    Home Living Family/patient expects to be discharged to:: Private residence Living Arrangements: Spouse/significant other Available Help at Discharge: Family;Available 24 hours/day Type of Home: House Home Access: Level entry     Home Layout: One level Home Equipment: Cane - single point Additional Comments: has been independent with SPC, able to care for herself and house    Prior Function Level of Independence: Independent with assistive device(s)               Hand Dominance   Dominant Hand: Right    Extremity/Trunk Assessment   Upper Extremity Assessment Upper Extremity Assessment: Overall WFL for tasks assessed    Lower Extremity Assessment Lower Extremity Assessment: Overall WFL for tasks assessed    Cervical / Trunk Assessment Cervical / Trunk Assessment: Normal  Communication   Communication: No difficulties  Cognition Arousal/Alertness: Awake/alert Behavior During Therapy: WFL for tasks assessed/performed Overall Cognitive Status:  Within Functional Limits for tasks assessed                                        General Comments General comments (skin integrity, edema, etc.): pt was seen for mobility using the vitals cart, with monitoring of her sats and pulse ongoing.  Pt was stable for gait with no supplemental O2    Exercises      Assessment/Plan    PT Assessment Patient needs continued PT services  PT Problem List Decreased range of motion;Decreased activity tolerance;Decreased balance;Decreased mobility;Decreased safety awareness;Cardiopulmonary status limiting activity       PT Treatment Interventions DME instruction;Gait training;Functional mobility training;Therapeutic activities;Therapeutic exercise;Neuromuscular re-education;Balance training;Patient/family education    PT Goals (Current goals can be found in the Care Plan section)  Acute Rehab PT Goals Patient Stated Goal: to walk and be able to go home PT Goal Formulation: With patient/family Time For Goal Achievement: 04/01/19 Potential to Achieve Goals: Good    Frequency Min 3X/week   Barriers to discharge   level home with SPC previously used, with spouse to assist    Co-evaluation               AM-PAC PT "6 Clicks" Mobility  Outcome Measure Help needed turning from your back to your side while in a flat bed without using bedrails?: None Help needed moving from lying on your back to sitting on the side of a flat bed without using bedrails?: A Little Help needed moving to and from a bed to a chair (including a wheelchair)?: A Little Help needed standing up from a chair using your arms (e.g., wheelchair or bedside chair)?: A Little Help needed to walk in hospital room?: A Little Help needed climbing 3-5 steps with a railing? : A Lot 6 Click Score: 18    End of Session Equipment Utilized During Treatment: Gait belt Activity Tolerance: Patient limited by fatigue;Treatment limited secondary to medical complications (Comment) Patient left: in bed;with call bell/phone within reach;with bed alarm set;with family/visitor present Nurse Communication: Mobility status;Other (comment)(purwick is out) PT Visit Diagnosis: Unsteadiness on feet (R26.81);Muscle weakness (generalized) (M62.81);Difficulty in walking, not elsewhere classified (R26.2)     Time: [id number] PT Time Calculation (min) (ACUTE ONLY): 32 min   Charges:   PT Evaluation $PT Eval Moderate Complexity: 1 Mod PT Treatments $Gait Training: 8-22 mins       Ramond Dial 03/18/2019, 7:33 PM   Mee Hives, PT MS Acute Rehab Dept. Number: [place] and [place]

## 2019-03-18 NOTE — Consult Note (Addendum)
[facility] Consultation Note  Requesting MD: Dr Hosie Poisson Reason for consult: Hyponatremia  HPI:  Lynn Stone is a 68 y.o. female with history of asthma, hypertension, acid reflux, esophagitis, hypothyroidism, chronic hyponatremia who was admitted on 12/21 for nausea vomiting diarrhea with serum sodium level of 115, seen as a consultation at the request of Dr. Karleen Hampshire for the management of hyponatremia.  Patient has a chronic hyponatremia with baseline serum sodium level around 124-128 as back as since 2011.  Patient states that her PCP always tells her about low sodium level.  No definitive diagnosis of heart.  She is not on diuretics.  Initially the hyponatremia was thought to be due to GI loss therefore started on normal saline with improvement in sodium level to 119 however the repeat lab this afternoon so sodium level went down to 118 therefore we are consulted to evaluate. She is now getting normal saline IV.  The TSH level and a.m. cortisol level was unremarkable.  The urinary studies showed sodium 64 and urine osmolality 357.  The serum osmolality was 245.  She complains of intermittent abdominal pain because of diagnosis of esophagitis associated with nausea.  Today's had diarrhea and GI symptoms has much improved. Denied headache, dizziness, confusion, chest pain, shortness of breath.  No urinary complaint.    Her husband at bedside.   Creat  Date/Time Value Ref Range Status  02/11/2014 04:23 PM 0.53 0.50 - 1.10 mg/dL Final   Creatinine, Ser  Date/Time Value Ref Range Status  03/18/2019 01:32 PM 0.64 0.44 - 1.00 mg/dL Final  03/17/2019 10:50 AM 0.62 0.44 - 1.00 mg/dL Final  10/07/2009 10:35 AM 0.60 0.4 - 1.2 mg/dL Final  10/07/2009 12:00 AM 0.60 mg/dL   10/06/2009 07:06 PM 0.8 0.4 - 1.2 mg/dL Final  10/06/2009 12:00 AM 0.8 mg/dL   06/17/2009 12:00 AM 0.73 mg/dL   06/05/2008 12:00 AM 0.73 mg/dL      PMHx:   Past Medical History:   Diagnosis Date  . Asthma   . GERD (gastroesophageal reflux disease)   . Hypertension   . Seasonal allergies   . Thyroid disease     Past Surgical History:  Procedure Laterality Date  . ABDOMINAL HYSTERECTOMY    . CESAREAN SECTION     x 3  . CHOLECYSTECTOMY      Family Hx:  Family History  Problem Relation Age of Onset  . Heart disease Father     Social History:  reports that she has never smoked. She has never used smokeless tobacco. She reports that she does not drink alcohol or use drugs.  Allergies:  Allergies  Allergen Reactions  . Eggs Or Egg-Derived Products Shortness Of Breath  . Erythromycin Rash    Medications: Prior to Admission medications   Medication Sig Start Date End Date Taking? Authorizing Provider  albuterol (PROVENTIL HFA;VENTOLIN HFA) 108 (90 BASE) MCG/ACT inhaler Inhale into the lungs every 6 (six) hours as needed for wheezing or shortness of breath.   Yes [provider]  albuterol (PROVENTIL HFA;VENTOLIN HFA) 108 (90 BASE) MCG/ACT inhaler Inhale 2 puffs into the lungs every 4 (four) hours as needed for wheezing or shortness of breath. 02/11/14  Yes Jacqulyn Cane, MD  esomeprazole (NEXIUM) 40 MG capsule Take 40 mg by mouth daily at 12 noon.   Yes [provider]  fluticasone Asencion Islam) 50 MCG/ACT nasal spray 1 or 2 sprays each nostril twice a day 02/11/14  Yes Jacqulyn Cane, MD  Fluticasone-Salmeterol (ADVAIR DISKUS)  250-50 MCG/DOSE AEPB Inhale 1 puff into the lungs 2 (two) times daily.   Yes [provider]  levothyroxine (SYNTHROID, LEVOTHROID) 100 MCG tablet Take 100 mcg by mouth daily before breakfast.   Yes [provider]  ramipril (ALTACE) 10 MG capsule Take 10 mg by mouth daily.   Yes [provider]    I have reviewed the patient's current medications.  Labs:  Results for orders placed or performed during the hospital encounter of 03/17/19 (from the past 48 hour(s))  Lipase, blood     Status:  None   Collection Time: 03/17/19 10:50 AM  Result Value Ref Range   Lipase 21 11 - 51 U/L    Comment: Performed at [facility], [place] [address] [place], [place] [address]  Comprehensive metabolic panel     Status: Abnormal   Collection Time: 03/17/19 10:50 AM  Result Value Ref Range   Sodium 115 (LL) 135 - 145 mmol/L    Comment: CRITICAL RESULT CALLED TO, READ BACK BY AND VERIFIED WITH: L.CARTER,RN 03/17/2019 1212 DAVISB    Potassium 2.8 (L) 3.5 - 5.1 mmol/L   Chloride 79 (L) 98 - 111 mmol/L   CO2 22 22 - 32 mmol/L   Glucose, Bld 190 (H) 70 - 99 mg/dL   BUN 8 8 - 23 mg/dL   Creatinine, Ser 0.62 0.44 - 1.00 mg/dL   Calcium 9.6 8.9 - 10.3 mg/dL   Total Protein 7.5 6.5 - 8.1 g/dL   Albumin 4.7 3.5 - 5.0 g/dL   AST 32 15 - 41 U/L   ALT 26 0 - 44 U/L   Alkaline Phosphatase 74 38 - 126 U/L   Total Bilirubin 0.8 0.3 - 1.2 mg/dL   GFR calc non Af Amer >60 >60 mL/min   GFR calc Af Amer >60 >60 mL/min   Anion gap 14 5 - 15    Comment: Performed at [facility], [place] [address], [place], [place] [address]  CBC     Status: Abnormal   Collection Time: 03/17/19 10:50 AM  Result Value Ref Range   WBC 15.0 (H) 4.0 - 10.5 K/uL   RBC 4.73 3.87 - 5.11 MIL/uL   Hemoglobin 13.6 12.0 - 15.0 g/dL   HCT 38.2 36.0 - 46.0 %   MCV 80.8 80.0 - 100.0 fL   MCH 28.8 26.0 - 34.0 pg   MCHC 35.6 30.0 - 36.0 g/dL   RDW 12.3 11.5 - 15.5 %   Platelets 320 150 - 400 K/uL   nRBC 0.0 0.0 - 0.2 %    Comment: Performed at [facility], [place] [address], [place], [place] [address]  POC SARS Coronavirus 2 Ag-ED - Nasal Swab (BD Veritor Kit)     Status: None   Collection Time: 03/17/19 12:03 PM  Result Value Ref Range   SARS Coronavirus 2 Ag NEGATIVE NEGATIVE    Comment: (NOTE) SARS-CoV-2 antigen NOT DETECTED.  Negative results are presumptive.  Negative results do not preclude SARS-CoV-2 infection and should not be used as the sole basis for treatment or other patient management decisions,  including infection  control decisions, particularly in the presence of clinical signs and  symptoms consistent with COVID-19, or in those who have been in contact with the virus.  Negative results must be combined with clinical observations, patient history, and epidemiological information. The expected result is Negative. Fact Sheet for Patients: PodPark.tn Fact Sheet for Healthcare Providers: GiftContent.is This test is not yet  approved or cleared by the [place] and  has been authorized for detection and/or diagnosis of SARS-CoV-2 by FDA under an Emergency Use Authorization (EUA).  This EUA will remain in effect (meaning this test can be used) for the duration of  the COVID-19 de claration under Section 564(b)(1) of the Act, 21 U.S.C. section 360bbb-3(b)(1), unless the authorization is terminated or revoked sooner.   Magnesium     Status: None   Collection Time: 03/17/19 12:20 PM  Result Value Ref Range   Magnesium 1.9 1.7 - 2.4 mg/dL    Comment: Performed at [facility], [place] [address] [place], [place] [address]  SARS CORONAVIRUS 2 (TAT 6-24 HRS) Nasopharyngeal Nasopharyngeal Swab     Status: None   Collection Time: 03/17/19  1:37 PM   Specimen: Nasopharyngeal Swab  Result Value Ref Range   SARS Coronavirus 2 NEGATIVE NEGATIVE    Comment: (NOTE) SARS-CoV-2 target nucleic acids are NOT DETECTED. The SARS-CoV-2 RNA is generally detectable in upper and lower respiratory specimens during the acute phase of infection. Negative results do not preclude SARS-CoV-2 infection, do not rule out co-infections with other pathogens, and should not be used as the sole basis for treatment or other patient management decisions. Negative results must be combined with clinical observations, patient history, and epidemiological information. The expected result is Negative. Fact Sheet for  Patients: SugarRoll.be Fact Sheet for Healthcare Providers: https://www.woods-mathews.com/ This test is not yet approved or cleared by the [place] FDA and  has been authorized for detection and/or diagnosis of SARS-CoV-2 by FDA under an Emergency Use Authorization (EUA). This EUA will remain  in effect (meaning this test can be used) for the duration of the COVID-19 declaration under Section 56 4(b)(1) of the Act, 21 U.S.C. section 360bbb-3(b)(1), unless the authorization is terminated or revoked sooner. Performed at [facility], [place] [address], [place], [place] [address]   Hemoglobin A1c     Status: Abnormal   Collection Time: 03/17/19  6:16 PM  Result Value Ref Range   Hgb A1c MFr Bld 6.0 (H) 4.8 - 5.6 %    Comment: (NOTE) Pre diabetes:          5.7%-6.4% Diabetes:              >6.4% Glycemic control for   <7.0% adults with diabetes    Mean Plasma Glucose 125.5 mg/dL    Comment: Performed at [facility] [address] [place], [place] [address]  Sodium     Status: Abnormal   Collection Time: 03/17/19  7:22 PM  Result Value Ref Range   Sodium 116 (LL) 135 - 145 mmol/L    Comment: CRITICAL RESULT CALLED TO, READ BACK BY AND VERIFIED WITH: Lerry Paterson 2034 03/17/2019 WBOND Performed at [facility], [place] [address] [place], [place] [address]   Urinalysis, Routine w reflex microscopic     Status: Abnormal   Collection Time: 03/17/19  8:01 PM  Result Value Ref Range   Color, Urine STRAW (A) YELLOW   APPearance CLEAR CLEAR   Specific Gravity, Urine 1.009 1.005 - 1.030   pH 7.0 5.0 - 8.0   Glucose, UA 50 (A) NEGATIVE mg/dL   Hgb urine dipstick NEGATIVE NEGATIVE   Bilirubin Urine NEGATIVE NEGATIVE   Ketones, ur 20 (A) NEGATIVE mg/dL   Protein, ur 30 (A) NEGATIVE mg/dL   Nitrite NEGATIVE NEGATIVE   Leukocytes,Ua NEGATIVE NEGATIVE   RBC / HPF 0-5 0 -  5 RBC/hpf   WBC, UA 0-5 0 - 5 WBC/hpf   Bacteria, UA NONE  SEEN NONE SEEN   Squamous Epithelial / LPF 0-5 0 - 5    Comment: Performed at [facility], [place] [address], [place], [place] [address]  Sodium, urine, random     Status: None   Collection Time: 03/17/19  8:01 PM  Result Value Ref Range   Sodium, Ur 64 mmol/L    Comment: Performed at [place] [address] [place], [place] [address]  Osmolality, urine     Status: None   Collection Time: 03/17/19  8:01 PM  Result Value Ref Range   Osmolality, Ur 357 300 - 900 mOsm/kg    Comment: Performed at [place] [address], [place], [place] [address]  Glucose, capillary     Status: Abnormal   Collection Time: 03/17/19 10:27 PM  Result Value Ref Range   Glucose-Capillary 141 (H) 70 - 99 mg/dL  Sodium serum     Status: Abnormal   Collection Time: 03/17/19 11:55 PM  Result Value Ref Range   Sodium 118 (LL) 135 - 145 mmol/L    Comment: CRITICAL RESULT CALLED TO, READ BACK BY AND VERIFIED WITH: Naaman Plummer M,RN 03/18/19 0107 WAYK Performed at [facility], [place] [address], [place], [place] [address]   Cortisol     Status: None   Collection Time: 03/17/19 11:55 PM  Result Value Ref Range   Cortisol, Plasma 22.7 ug/dL    Comment: (NOTE) AM    6.7 - 22.6 ug/dL PM   <10.0       ug/dL Performed at [facility] [address] [place], [place] [address]   Osmolality     Status: Abnormal   Collection Time: 03/17/19 11:55 PM  Result Value Ref Range   Osmolality 245 (LL) 275 - 295 mOsm/kg    Comment: REPEATED TO VERIFY CRITICAL RESULT CALLED TO, READ BACK BY AND VERIFIED WITH: DALLAS HART RN.@0338  ON 12.22.2020 BY TCALDWELL MT. Performed at [facility], [place] [address] [place], [place] [address]   TSH     Status: None   Collection Time: 03/17/19 11:55 PM  Result Value Ref Range   TSH 1.350 0.350 - 4.500 uIU/mL    Comment: Performed by a 3rd Generation assay with a functional sensitivity of <=0.01 uIU/mL. Performed at [facility],  [place] [address] [place], [place] [address]   Sodium serum     Status: Abnormal   Collection Time: 03/18/19  5:28 AM  Result Value Ref Range   Sodium 119 (LL) 135 - 145 mmol/L    Comment: CRITICAL RESULT CALLED TO, READ BACK BY AND VERIFIED WITH: SMITH,M RN @0648  ON [id number] BY FLEMINGS Performed at [facility], [place] [address] [place], [place] [address]   HIV Antibody (routine testing w rflx)     Status: None   Collection Time: 03/18/19  5:28 AM  Result Value Ref Range   HIV Screen 4th Generation wRfx NON REACTIVE NON REACTIVE    Comment: Performed at [place] [facility] [address] [place], [place] [address]  Glucose, capillary     Status: Abnormal   Collection Time: 03/18/19  6:46 AM  Result Value Ref Range   Glucose-Capillary 121 (H) 70 - 99 mg/dL  Glucose, capillary     Status: Abnormal   Collection Time: 03/18/19 11:20 AM  Result Value Ref Range  Glucose-Capillary 112 (H) 70 - 99 mg/dL  Basic metabolic panel     Status: Abnormal   Collection Time: 03/18/19  1:32 PM  Result Value Ref Range   Sodium 118 (LL) 135 - 145 mmol/L    Comment: CRITICAL RESULT CALLED TO, READ BACK BY AND VERIFIED WITH: BERNABE,T RN @1403  ON [id number] BY FLEMINGS    Potassium 2.7 (LL) 3.5 - 5.1 mmol/L    Comment: CRITICAL RESULT CALLED TO, READ BACK BY AND VERIFIED WITH: BERNABE,T RN @1403  ON [id number] BY FLEMINGS    Chloride 85 (L) 98 - 111 mmol/L   CO2 23 22 - 32 mmol/L   Glucose, Bld 162 (H) 70 - 99 mg/dL   BUN 6 (L) 8 - 23 mg/dL   Creatinine, Ser 0.64 0.44 - 1.00 mg/dL   Calcium 8.5 (L) 8.9 - 10.3 mg/dL   GFR calc non Af Amer >60 >60 mL/min   GFR calc Af Amer >60 >60 mL/min   Anion gap 10 5 - 15    Comment: Performed at [facility], [place] [address], [place], [place] [address]  CBC     Status: Abnormal   Collection Time: 03/18/19  1:32 PM  Result Value Ref Range   WBC 14.4 (H) 4.0 - 10.5 K/uL   RBC 4.55 3.87 - 5.11 MIL/uL   Hemoglobin 12.9 12.0 - 15.0 g/dL   HCT 36.7 36.0 -  46.0 %   MCV 80.7 80.0 - 100.0 fL   MCH 28.4 26.0 - 34.0 pg   MCHC 35.1 30.0 - 36.0 g/dL   RDW 12.7 11.5 - 15.5 %   Platelets 225 150 - 400 K/uL   nRBC 0.0 0.0 - 0.2 %    Comment: Performed at [facility], [place] [address], [place], [place] [address]     ROS:  Pertinent items noted in HPI and remainder of comprehensive ROS otherwise negative.  Physical Exam: Vitals:   03/18/19 0842 03/18/19 1332  BP: (!) 147/84 (!) 165/80  Pulse: 91 100  Resp: 18 20  Temp: 97.9 F (36.6 C) 98.2 F (36.8 C)  SpO2: 97% 97%     General exam: Appears calm and comfortable  Respiratory system: Clear to auscultation. Respiratory effort normal. No wheezing or crackle Cardiovascular system: S1 & S2 heard, RRR. trace pedal edema. Gastrointestinal system: Abdomen is nondistended, soft and nontender. Normal bowel sounds heard. Central nervous system: Alert and oriented. No focal neurological deficits. Extremities: Symmetric 5 x 5 power. Skin: No rashes, lesions or ulcers Psychiatry: Judgement and insight appear normal. Mood & affect appropriate.   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  Both of these symptoms are potent stimulator of ADH release.  Urinary studies with high sodium and osmolality.  TSH and a.m. cortisol level normal.  The baseline sodium seems to be around 124-128 since 2011. I will discontinue normal saline IV fluid. Try small dose of Lasix 20 mg to increase free water excretion. Start salt tablet to increase solute. Fluid restriction to 1200 cc in 24 hours She has no neurological symptoms and her GI symptoms are improving.  I do not think she needs hypertonic saline today.  Repeat sodium level this evening.  Avoid rapid sodium correction.  #Hypokalemia: Probably worsened because of IV fluid. Replete both IV and oral potassium  chloride.  #Hypertension: Blood pressure elevated.  Continue current medication.  A  dose of Lasix as above.  #Nausea vomiting and diarrhea with history of chronic esophagitis plan supportive care as per primary team.  It seems like the symptoms are improving.  Thank you very much for the consult.  Will follow with you.  Annissa Andreoni Tanna Furry 03/18/2019, 3:06 PM  [facility].

## 2019-03-18 NOTE — Progress Notes (Signed)
Critical Na and Serum Osmolality level.  Informed provider.

## 2019-03-19 DIAGNOSIS — E871 Hypo-osmolality and hyponatremia: Secondary | ICD-10-CM

## 2019-03-19 LAB — BASIC METABOLIC PANEL
Anion gap: 8 (ref 5–15)
BUN: 7 mg/dL — ABNORMAL LOW (ref 8–23)
CO2: 23 mmol/L (ref 22–32)
Calcium: 8.9 mg/dL (ref 8.9–10.3)
Chloride: 94 mmol/L — ABNORMAL LOW (ref 98–111)
Creatinine, Ser: 0.76 mg/dL (ref 0.44–1.00)
GFR calc Af Amer: >60 mL/min (ref >60)
GFR calc non Af Amer: >60 mL/min (ref >60)
Glucose, Bld: 115 mg/dL — ABNORMAL HIGH (ref 70–99)
Potassium: 4 mmol/L (ref 3.5–5.1)
Sodium: 125 mmol/L — ABNORMAL LOW (ref 135–145)

## 2019-03-19 LAB — CBC
HCT: 38.2 % (ref 36.0–46.0)
Hemoglobin: 13.4 g/dL (ref 12.0–15.0)
MCH: 29 pg (ref 26.0–34.0)
MCHC: 35.1 g/dL (ref 30.0–36.0)
MCV: 82.7 fL (ref 80.0–100.0)
Platelets: 219 10*3/uL (ref 150–400)
RBC: 4.62 MIL/uL (ref 3.87–5.11)
RDW: 13.2 % (ref 11.5–15.5)
WBC: 11 10*3/uL — ABNORMAL HIGH (ref 4.0–10.5)
nRBC: 0 % (ref 0.0–0.2)

## 2019-03-19 LAB — RENAL FUNCTION PANEL
Albumin: 3.8 g/dL (ref 3.5–5.0)
Anion gap: 8 (ref 5–15)
BUN: 8 mg/dL (ref 8–23)
CO2: 24 mmol/L (ref 22–32)
Calcium: 8.9 mg/dL (ref 8.9–10.3)
Chloride: 93 mmol/L — ABNORMAL LOW (ref 98–111)
Creatinine, Ser: 0.8 mg/dL (ref 0.44–1.00)
GFR calc Af Amer: >60 mL/min (ref >60)
GFR calc non Af Amer: >60 mL/min (ref >60)
Glucose, Bld: 121 mg/dL — ABNORMAL HIGH (ref 70–99)
Phosphorus: 2.3 mg/dL — ABNORMAL LOW (ref 2.5–4.6)
Potassium: 3.3 mmol/L — ABNORMAL LOW (ref 3.5–5.1)
Sodium: 125 mmol/L — ABNORMAL LOW (ref 135–145)

## 2019-03-19 MED ORDER — FUROSEMIDE 20 MG PO TABS
10.0000 mg | ORAL_TABLET | Freq: Every day | ORAL | Status: DC
  Administered 2019-03-19: 10 mg via ORAL
  Filled 2019-03-19: qty 1

## 2019-03-19 MED ORDER — RAMIPRIL 2.5 MG PO CAPS
10.0000 mg | ORAL_CAPSULE | Freq: Two times a day (BID) | ORAL | Status: DC
  Administered 2019-03-19 – 2019-03-20 (×3): 10 mg via ORAL
  Filled 2019-03-19 (×3): qty 4

## 2019-03-19 MED ORDER — POTASSIUM CHLORIDE CRYS ER 20 MEQ PO TBCR
40.0000 meq | EXTENDED_RELEASE_TABLET | Freq: Once | ORAL | Status: AC
  Administered 2019-03-19: 40 meq via ORAL

## 2019-03-19 MED ORDER — PANTOPRAZOLE SODIUM 40 MG PO TBEC
40.0000 mg | DELAYED_RELEASE_TABLET | Freq: Every day | ORAL | Status: DC
  Administered 2019-03-20: 40 mg via ORAL
  Filled 2019-03-19: qty 1

## 2019-03-19 NOTE — Progress Notes (Signed)
Subjective:  Sodium supposedly corrected by 9 in 6 hours but if take previous value by 7 in 14 hours after fluid restriction and one dose of lasix 20 mg with sodium chloride tabs.  BP is high   Objective Vital signs in last 24 hours: Vitals:   03/18/19 2104 03/19/19 0408 03/19/19 0410 03/19/19 0713  BP:   (!) 170/81   Pulse:   86 93  Resp:   20 18  Temp:   98.8 F (37.1 C)   TempSrc:   Oral   SpO2: 97%  98% 96%  Weight:  82.1 kg     Weight change: -1.27 kg  Intake/Output Summary (Last 24 hours) at 03/19/2019 [id number] Last data filed at 03/19/2019 0408 Gross per 24 hour  Intake 1486.92 ml  Output 1250 ml  Net 236.92 ml    Assessment/ Plan: Pt is a 68 y.o. yo female with baseline hyponatremia (124-128) who was admitted on 03/17/2019 with N/V/D and sodium of 115-  Labs c/w SIADH   Assessment/Plan: 1. Hyponatremia-  Labs c/w SIADH.  The treatment for that is fluid restriction and a med to dilute urine.  So will continue fluid restriction of 1200 and lasix very low dose 10 mg daily.  Will need to watch for volume depletion but now with high BP that does not seem to be an issue. Will stop salt tabs as nausea is better.  Ordered labs to look at sodium this afternoon and in the AM  2.  Hypertension-  Argues against volume depletion.  Will stop salt tabs.  On hydralazine-  Can probably be fine tuned as OP 3.  Dispo-  If sodium does not drop significantly tomorrow I think Ok for discharge if other issues stable    Louis Meckel    Labs: Basic Metabolic Panel: Recent Labs  Lab 03/18/19 1332 03/18/19 1943 03/19/19 0348  NA 118* 116* 125*  K 2.7* 3.6 4.0  CL 85* 85* 94*  CO2 23 22 23   GLUCOSE 162* 114* 115*  BUN 6* 7* 7*  CREATININE 0.64 0.67 0.76  CALCIUM 8.5* 8.6* 8.9   Liver Function Tests: Recent Labs  Lab 03/17/19 1050  AST 32  ALT 26  ALKPHOS 74  BILITOT 0.8  PROT 7.5  ALBUMIN 4.7   Recent Labs  Lab 03/17/19 1050  LIPASE 21   No results for input(s):  AMMONIA in the last 168 hours. CBC: Recent Labs  Lab 03/17/19 1050 03/18/19 1332  WBC 15.0* 14.4*  HGB 13.6 12.9  HCT 38.2 36.7  MCV 80.8 80.7  PLT 320 225   Cardiac Enzymes: No results for input(s): CKTOTAL, CKMB, CKMBINDEX, TROPONINI in the last 168 hours. CBG: Recent Labs  Lab 03/17/19 2227 03/18/19 0646 03/18/19 1120 03/18/19 1648 03/18/19 2135  GLUCAP 141* 121* 112* 102* 122*    Iron Studies: No results for input(s): IRON, TIBC, TRANSFERRIN, FERRITIN in the last 72 hours. Studies/Results: CT HEAD WO CONTRAST  Result Date: 03/17/2019 CLINICAL DATA:  68 year old female with history of acute onset of a headache. Epigastric pain with vomiting for the past 2 days. EXAM: CT HEAD WITHOUT CONTRAST TECHNIQUE: Contiguous axial images were obtained from the base of the skull through the vertex without intravenous contrast. COMPARISON:  No priors. FINDINGS: Brain: Patchy areas of decreased attenuation are noted throughout the deep and periventricular white matter of the cerebral hemispheres bilaterally, compatible with mild chronic microvascular ischemic disease. No evidence of acute infarction, hemorrhage, hydrocephalus, extra-axial collection or mass lesion/mass effect.  Vascular: No hyperdense vessel or unexpected calcification. Skull: Normal. Negative for fracture or focal lesion. Sinuses/Orbits: No acute finding. Other: None. IMPRESSION: 1. No acute intracranial abnormalities. 2. Mild chronic microvascular ischemic changes in the cerebral white matter. Electronically Signed   By: Vinnie Langton M.D.   On: 03/17/2019 19:52   DG Chest Port 1 View  Result Date: 03/17/2019 CLINICAL DATA:  Chest pain EXAM: PORTABLE CHEST 1 VIEW COMPARISON:  10/06/2009 FINDINGS: Heart size upper normal. Negative for heart failure. Lungs well aerated without infiltrate effusion or mass. No acute skeletal abnormality. IMPRESSION: No active disease. Electronically Signed   By: Franchot Gallo M.D.   On:  03/17/2019 13:22   Medications: Infusions:   Scheduled Medications: . enoxaparin (LOVENOX) injection  40 mg Subcutaneous Daily  . levothyroxine  100 mcg Oral QAC breakfast  . mirtazapine  7.5 mg Oral QHS  . mometasone-formoterol  2 puff Inhalation BID  . pantoprazole (PROTONIX) IV  40 mg Intravenous Q24H  . potassium chloride  40 mEq Oral BID  . sodium chloride flush  3 mL Intravenous Once  . sodium chloride  2 g Oral TID WC    have reviewed scheduled and prn medications.  Physical Exam: General: NAD Heart: RRR Lungs: clear Abdomen: soft, non tender Extremities: no edema     03/19/2019,8:28 AM  LOS: 1 day

## 2019-03-19 NOTE — Progress Notes (Signed)
Physical Therapy Treatment Patient Details Name: Lynn Stone MRN: [id number] DOB: 1950/09/21 Today's Date: 03/19/2019    History of Present Illness 68 yo female with onset of N&V, diarrhea at home was admitted, has been cleared by CT of head and chest xray.  Has prolonged QT interval, leukocytosis, hyponatremia.  PMHx:  insomnia, hypothyriodism, migraines, esophagitis, asthma, HTN, DM,     PT Comments    Patient seen for mobility progression. Current plan remains appropriate.    Follow Up Recommendations  Home health PT;Supervision for mobility/OOB     Equipment Recommendations  None recommended by PT    Recommendations for Other Services       Precautions / Restrictions Precautions Precautions: Fall    Mobility  Bed Mobility Overal bed mobility: Modified Independent Bed Mobility: Supine to Sit;Sit to Supine           General bed mobility comments: increased effort   Transfers Overall transfer level: Modified independent   Transfers: Sit to/from Stand              Ambulation/Gait Ambulation/Gait assistance: Supervision Gait Distance (Feet): (~100 ft total in room) Assistive device: Straight cane Gait Pattern/deviations: Step-through pattern;Decreased stride length(lateral sway) Gait velocity: reduced   General Gait Details: supervision for safety; no LOB but pt c/o dizziness while mobilizing    Stairs             Wheelchair Mobility    Modified Rankin (Stroke Patients Only)       Balance Overall balance assessment: Needs assistance Sitting-balance support: Feet supported Sitting balance-Leahy Scale: Good       Standing balance-Leahy Scale: Fair Standing balance comment: pt able to stand without UE support                             Cognition Arousal/Alertness: Awake/alert Behavior During Therapy: WFL for tasks assessed/performed Overall Cognitive Status: Within Functional Limits for tasks assessed                                        Exercises      General Comments General comments (skin integrity, edema, etc.): VSS       Pertinent Vitals/Pain Pain Assessment: Faces Faces Pain Scale: Hurts a little bit Pain Location: chronic back pain Pain Descriptors / Indicators: Aching Pain Intervention(s): Repositioned    Home Living                      Prior Function            PT Goals (current goals can now be found in the care plan section) Progress towards PT goals: Progressing toward goals    Frequency    Min 3X/week      PT Plan Current plan remains appropriate    Co-evaluation              AM-PAC PT "6 Clicks" Mobility   Outcome Measure  Help needed turning from your back to your side while in a flat bed without using bedrails?: None Help needed moving from lying on your back to sitting on the side of a flat bed without using bedrails?: A Little Help needed moving to and from a bed to a chair (including a wheelchair)?: A Little Help needed standing up from a chair using your arms (e.g., wheelchair or bedside chair)?:  A Little Help needed to walk in hospital room?: A Little Help needed climbing 3-5 steps with a railing? : A Lot 6 Click Score: 18    End of Session Equipment Utilized During Treatment: Gait belt Activity Tolerance: Patient tolerated treatment well Patient left: in bed;with call bell/phone within reach;with family/visitor present Nurse Communication: Mobility status PT Visit Diagnosis: Unsteadiness on feet (R26.81);Muscle weakness (generalized) (M62.81);Difficulty in walking, not elsewhere classified (R26.2)     Time: 1550-1610 PT Time Calculation (min) (ACUTE ONLY): 20 min  Charges:  $Gait Training: 8-22 mins                     Lynn Stone, PTA Acute Rehabilitation Services Pager: [phone] Office: [phone]     Darliss Cheney 03/19/2019, 5:12 PM

## 2019-03-19 NOTE — Progress Notes (Signed)
PROGRESS NOTE    Lynn Stone  [id number] DOB: 10/16/1950 DOA: 03/17/2019 PCP: Brett Canales, MD   Brief Narrative:  Lynn Stone is a 68 y.o. female with medical history significant of asthma, esophagitis, GERD, hypertension, hypothyroidism, presents to ED with complaints of nausea, vomiting and diarrhea for over a week,. She was found to have low sodium of 115 and potassium of 2.8.  She was admitted for evaluation of hyponatremia and diarrhea.  Improving sodium levels with fluid restriction and Lasix.  Only 1 bowel movement overnight and unfortunately we could not get any sample to send it for analysis. Continue to monitor and if sodium level stays stable possible discharge in the morning.  Assessment & Plan:   Active Problems:   Hypothyroidism   Migraine headache   Essential hypertension   Asthma   Esophagitis   Nausea with vomiting   Hyponatremia  Nausea, vomiting and diarrhea:  differential include gastritis vs gastroenteritis.  Symptoms improved with IV zofran and IV PPI.  Transition to oral PPI Advance her diet and she is able to tolerate soft diet without any issues. Only 1 loose bowel movement last night but unfortunately a stool sample could not be obtained to send it to lab for GI pathogen PCR.  Continue to monitor.   Hyponatremia Improvement in sodium levels after fluid restriction and Lasix. 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 Cortisol is 22.  Nephrology consulted for further recommendations.  Patient reports severe dizziness with Lasix hence we held the dose for tomorrow.  We will obtain orthostatic blood pressure parameters   Hypothyroidism  Resume Synthroid   Asthma:  No wheezing heard, continue with albuterol  Insomnia: Psychiatry recommending Remeron.    Essential hypertension: Blood pressure parameters improved, ramipril restarted.   Hypokalemia:  Replaced  morning   DVT prophylaxis: Lovenox.  Code Status: full code.  Family Communication: family at bedside.  Disposition Plan: Pending improvement in the sodium  Consultants:   nephrology  Procedures: None Antimicrobials: none  Subjective: No nausea today but reports severe persistent dizziness yesterday and today with lasix.   Objective: Vitals:   03/19/19 0410 03/19/19 0713 03/19/19 0835 03/19/19 1207  BP: (!) 170/81  (!) 152/88 (!) 159/87  Pulse: 86 93 82 94  Resp: 20 18 18 20   Temp: 98.8 F (37.1 C)  98.3 F (36.8 C) 98.4 F (36.9 C)  TempSrc: Oral  Oral Oral  SpO2: 98% 96% 96% 98%  Weight:        Intake/Output Summary (Last 24 hours) at 03/19/2019 1516 Last data filed at 03/19/2019 1200 Gross per 24 hour  Intake 1226.92 ml  Output 1850 ml  Net -623.08 ml   Filed Weights   03/17/19 2222 03/18/19 0504 03/19/19 0408  Weight: 83.4 kg 83 kg 82.1 kg    Examination:  General exam: calm and comfortable.  Respiratory system: clear to auscultation, no wheezing or rhonchi Cardiovascular system: S1S2 heard, RRR,  Gastrointestinal system: Abdomen is soft, non tender non distended , bowel sounds good.  Central nervous system: alert and oriented, non focal.  Extremities: trace pedal edema. Skin: no lesions. Or ulcers.  Psychiatry:  Mood appropriate.     Data Reviewed: I have personally reviewed following labs and imaging studies  CBC: Recent Labs  Lab 03/17/19 1050 03/18/19 1332 03/19/19 1245  WBC 15.0* 14.4* 11.0*  HGB 13.6 12.9 13.4  HCT 38.2 36.7 38.2  MCV  80.8 80.7 82.7  PLT 320 225 [id number]   Basic Metabolic Panel: Recent Labs  Lab 03/17/19 1050 03/17/19 1220 03/17/19 2355 03/18/19 0528 03/18/19 1332 03/18/19 1943 03/19/19 0348  NA 115*  --  118* 119* 118* 116* 125*  K 2.8*  --   --   --  2.7* 3.6 4.0  CL 79*  --   --   --  85* 85* 94*  CO2 22  --   --   --  23 22 23   GLUCOSE 190*  --   --   --  162* 114* 115*  BUN 8  --   --   --  6* 7* 7*   CREATININE 0.62  --   --   --  0.64 0.67 0.76  CALCIUM 9.6  --   --   --  8.5* 8.6* 8.9  MG  --  1.9  --   --   --   --   --    GFR: CrCl cannot be calculated (Unknown ideal weight.). Liver Function Tests: Recent Labs  Lab 03/17/19 1050  AST 32  ALT 26  ALKPHOS 74  BILITOT 0.8  PROT 7.5  ALBUMIN 4.7   Recent Labs  Lab 03/17/19 1050  LIPASE 21   No results for input(s): AMMONIA in the last 168 hours. Coagulation Profile: No results for input(s): INR, PROTIME in the last 168 hours. Cardiac Enzymes: No results for input(s): CKTOTAL, CKMB, CKMBINDEX, TROPONINI in the last 168 hours. BNP (last 3 results) No results for input(s): PROBNP in the last 8760 hours. HbA1C: Recent Labs    03/17/19 1816  HGBA1C 6.0*   CBG: Recent Labs  Lab 03/17/19 2227 03/18/19 0646 03/18/19 1120 03/18/19 1648 03/18/19 2135  GLUCAP 141* 121* 112* 102* 122*   Lipid Profile: No results for input(s): CHOL, HDL, LDLCALC, TRIG, CHOLHDL, LDLDIRECT in the last 72 hours. Thyroid Function Tests: Recent Labs    03/17/19 2355  TSH 1.350   Anemia Panel: No results for input(s): VITAMINB12, FOLATE, FERRITIN, TIBC, IRON, RETICCTPCT in the last 72 hours. Sepsis Labs: No results for input(s): PROCALCITON, LATICACIDVEN in the last 168 hours.  Recent Results (from the past 240 hour(s))  SARS CORONAVIRUS 2 (TAT 6-24 HRS) Nasopharyngeal Nasopharyngeal Swab     Status: None   Collection Time: 03/17/19  1:37 PM   Specimen: Nasopharyngeal Swab  Result Value Ref Range Status   SARS Coronavirus 2 NEGATIVE NEGATIVE Final    Comment: (NOTE) SARS-CoV-2 target nucleic acids are NOT DETECTED. The SARS-CoV-2 RNA is generally detectable in upper and lower respiratory specimens during the acute phase of infection. Negative results do not preclude SARS-CoV-2 infection, do not rule out co-infections with other pathogens, and should not be used as the sole basis for treatment or other patient management  decisions. Negative results must be combined with clinical observations, patient history, and epidemiological information. The expected result is Negative. Fact Sheet for Patients: SugarRoll.be Fact Sheet for Healthcare Providers: https://www.woods-mathews.com/ This test is not yet approved or cleared by the [place] FDA and  has been authorized for detection and/or diagnosis of SARS-CoV-2 by FDA under an Emergency Use Authorization (EUA). This EUA will remain  in effect (meaning this test can be used) for the duration of the COVID-19 declaration under Section 56 4(b)(1) of the Act, 21 U.S.C. section 360bbb-3(b)(1), unless the authorization is terminated or revoked sooner. Performed at [facility], [place] [address] [place], [place] [address]  Radiology Studies: CT HEAD WO CONTRAST  Result Date: 03/17/2019 CLINICAL DATA:  68 year old female with history of acute onset of a headache. Epigastric pain with vomiting for the past 2 days. EXAM: CT HEAD WITHOUT CONTRAST TECHNIQUE: Contiguous axial images were obtained from the base of the skull through the vertex without intravenous contrast. COMPARISON:  No priors. FINDINGS: Brain: Patchy areas of decreased attenuation are noted throughout the deep and periventricular white matter of the cerebral hemispheres bilaterally, compatible with mild chronic microvascular ischemic disease. No evidence of acute infarction, hemorrhage, hydrocephalus, extra-axial collection or mass lesion/mass effect. Vascular: No hyperdense vessel or unexpected calcification. Skull: Normal. Negative for fracture or focal lesion. Sinuses/Orbits: No acute finding. Other: None. IMPRESSION: 1. No acute intracranial abnormalities. 2. Mild chronic microvascular ischemic changes in the cerebral white matter. Electronically Signed   By: Vinnie Langton M.D.   On: 03/17/2019 19:52        Scheduled Meds: .  enoxaparin (LOVENOX) injection  40 mg Subcutaneous Daily  . levothyroxine  100 mcg Oral QAC breakfast  . mirtazapine  7.5 mg Oral QHS  . mometasone-formoterol  2 puff Inhalation BID  . [START ON 03/20/2019] pantoprazole  40 mg Oral Q0600  . potassium chloride  40 mEq Oral BID  . ramipril  10 mg Oral BID  . sodium chloride flush  3 mL Intravenous Once   Continuous Infusions:    LOS: 1 day        Hosie Poisson, MD [facility] 03/19/2019, 3:16 PM

## 2019-03-19 NOTE — Progress Notes (Signed)
Patient states she did have diarrhea once tonight. Patient flushed it, so no sample obtained. RN notified patient that we need a sample next time

## 2019-03-19 NOTE — Plan of Care (Signed)

## 2019-03-20 LAB — RENAL FUNCTION PANEL
Albumin: 3.7 g/dL (ref 3.5–5.0)
Anion gap: 10 (ref 5–15)
BUN: 10 mg/dL (ref 8–23)
CO2: 24 mmol/L (ref 22–32)
Calcium: 9.4 mg/dL (ref 8.9–10.3)
Chloride: 98 mmol/L (ref 98–111)
Creatinine, Ser: 0.7 mg/dL (ref 0.44–1.00)
GFR calc Af Amer: >60 mL/min (ref >60)
GFR calc non Af Amer: >60 mL/min (ref >60)
Glucose, Bld: 107 mg/dL — ABNORMAL HIGH (ref 70–99)
Phosphorus: 2.7 mg/dL (ref 2.5–4.6)
Potassium: 3.9 mmol/L (ref 3.5–5.1)
Sodium: 132 mmol/L — ABNORMAL LOW (ref 135–145)

## 2019-03-20 MED ORDER — FUROSEMIDE 20 MG PO TABS: 10.0000 mg | ORAL_TABLET | Freq: Every day | ORAL | 1 refills | Status: AC

## 2019-03-20 MED ORDER — ONDANSETRON HCL 4 MG PO TABS: 4.0000 mg | ORAL_TABLET | Freq: Every day | ORAL | 1 refills | Status: AC | PRN

## 2019-03-20 MED ORDER — LEVOTHYROXINE SODIUM 88 MCG PO TABS: 88.0000 ug | ORAL_TABLET | Freq: Every day | ORAL | Status: AC

## 2019-03-20 MED ORDER — RAMIPRIL 10 MG PO CAPS: 10.0000 mg | ORAL_CAPSULE | Freq: Two times a day (BID) | ORAL | 0 refills | Status: AC

## 2019-03-20 MED ORDER — ESOMEPRAZOLE MAGNESIUM 40 MG PO CPDR: 40.0000 mg | DELAYED_RELEASE_CAPSULE | Freq: Every day | ORAL | 1 refills | Status: AC

## 2019-03-20 MED ORDER — MIRTAZAPINE 7.5 MG PO TABS: 7.5000 mg | ORAL_TABLET | Freq: Every day | ORAL | 0 refills | Status: AC

## 2019-03-20 NOTE — Progress Notes (Signed)
Stevenson Clinch to be D/C'd Home per MD order.  Discussed with the patient and all questions fully answered.  VSS, Skin clean, dry and intact without evidence of skin break down, no evidence of skin tears noted.  IV catheter discontinued intact. Site without signs and symptoms of complications. Dressing and pressure applied.  An After Visit Summary was printed and given to the patient. Patient received prescription.  D/c education completed with patient/family including follow up instructions, medication list, d/c activities limitations if indicated, with other d/c instructions as indicated by MD - patient able to verbalize understanding, all questions fully answered.   Patient instructed to return to ED, call 911, or call MD for any changes in condition.   Patient escorted via [place], and D/C home via private auto.  Howard Pouch 03/20/2019 8:50 PM  Paged Karleen Hampshire [phone]. Pt. inquired about medication for nausea/stomach acid for discharge.

## 2019-03-20 NOTE — TOC Initial Note (Signed)
Transition of Care [facility]) - Initial/Assessment Note    Patient Details  Name: Lynn Stone MRN: [id number] Date of Birth: 07-Sep-1950  [facility] CM/SW Contact:    Marilu Favre, RN Phone Number: 03/20/2019, 11:41 AM  Clinical Narrative:                  Confirmed face sheet information. Patient from home with husband. Discussed home health PT. Patient declined at this time, due to not feeling comfortable with people coming in home due to covid.   Explained home health agencies screen employees for covid. Patient voiced understanding but politely declined. If she feels like she is comfortable with home health after discharge she will call her PCP to arrange. Expected Discharge Plan: Home/Self Care Barriers to Discharge: No Barriers Identified   Patient Goals and CMS Choice Patient states their goals for this hospitalization and ongoing recovery are:: to return to home CMS Medicare.gov Compare Post Acute Care list provided to:: Patient Choice offered to / list presented to : Patient  Expected Discharge Plan and Services Expected Discharge Plan: Home/Self Care   Discharge Planning Services: CM Consult Post Acute Care Choice: [place] arrangements for the past 2 months: Single Family Home Expected Discharge Date: 03/20/19               DME Arranged: N/A         HH Arranged: Refused HH          Prior Living Arrangements/Services Living arrangements for the past 2 months: Single Family Home Lives with:: Spouse Patient language and need for interpreter reviewed:: Yes Do you feel safe going back to the place where you live?: Yes      Need for Family Participation in Patient Care: Yes (Comment) Care giver support system in place?: Yes (comment)   Criminal Activity/Legal Involvement Pertinent to Current Situation/Hospitalization: No - Comment as needed  Activities of Daily Living Home Assistive Devices/Equipment: Cane (specify quad or  straight) ADL Screening (condition at time of admission) Patient's cognitive ability adequate to safely complete daily activities?: Yes Is the patient deaf or have difficulty hearing?: No Does the patient have difficulty seeing, even when wearing glasses/contacts?: No Does the patient have difficulty concentrating, remembering, or making decisions?: No Patient able to express need for assistance with ADLs?: Yes Does the patient have difficulty dressing or bathing?: No Independently performs ADLs?: Yes (appropriate for developmental age) Does the patient have difficulty walking or climbing stairs?: No Weakness of Legs: Both Weakness of Arms/Hands: Both  Permission Sought/Granted   Permission granted to share information with : No              Emotional Assessment Appearance:: Appears stated age     Orientation: : Oriented to Self, Oriented to Place, Oriented to  Time, Oriented to Situation Alcohol / Substance Use: Not Applicable Psych Involvement: No (comment)  Admission diagnosis:  Hypokalemia [E87.6] Hyponatremia [E87.1] Nausea and vomiting, intractability of vomiting not specified, unspecified vomiting type [R11.2] Hypertension, unspecified type [I10] Acute hyponatremia [E87.1] Patient Active Problem List   Diagnosis Date Noted  . Acute hyponatremia 03/18/2019  . Hyponatremia 03/17/2019  . CANDIDIASIS OF MOUTH 10/12/2009  . HYPERLIPIDEMIA 10/12/2009  . Migraine headache 10/12/2009  . ALLERGIC RHINITIS 10/12/2009  . Esophagitis 10/12/2009  . COLONIC POLYPS, HX OF 10/12/2009  . UTI'S, HX OF 10/12/2009  . Hypothyroidism 10/11/2009  . Type 2 diabetes mellitus without complication ([place]) [id number]  . Essential hypertension 10/11/2009  . Asthma  10/11/2009  . IRRITABLE BOWEL SYNDROME 10/11/2009  . Nausea with vomiting 10/07/2009  . EPIGASTRIC PAIN 10/07/2009   PCP:  Brett Canales, MD Pharmacy:   [id number] - [place], [place] [address] Phone: [phone] Fax: [phone]     Social Determinants of Health (SDOH) Interventions    Readmission Risk Interventions No flowsheet data found.

## 2019-03-20 NOTE — Progress Notes (Signed)
Subjective:  Sodium continues to correct fairly quickly - up 7 in 24 hours.  BP is still a little high but was down last night-  Now on hydralazine and ramipril 10 BID-  Says is dizzy    Objective Vital signs in last 24 hours: Vitals:   03/19/19 2050 03/19/19 2100 03/20/19 0439 03/20/19 0738  BP:  123/65 (!) 162/80 (!) 152/74  Pulse: 95 88 88 84  Resp: 20 20 18    Temp:  98.6 F (37 C) 98.6 F (37 C) 98.6 F (37 C)  TempSrc:  Oral Oral Oral  SpO2: 96% 95% 100% 97%  Weight:       Weight change:   Intake/Output Summary (Last 24 hours) at 03/20/2019 0855 Last data filed at 03/20/2019 [id number] Gross per 24 hour  Intake 560 ml  Output 1050 ml  Net -490 ml    Assessment/ Plan: Pt is a 68 y.o. yo female with baseline hyponatremia (124-128) who was admitted on 03/17/2019 with N/V/D and sodium of 115-  Labs c/w SIADH   Assessment/Plan: 1. Hyponatremia-  Labs c/w SIADH.  The treatment for that is fluid restriction and a med to dilute urine.  So will continue fluid restriction of 1200 and lasix very low dose 10 mg daily.  Will need to watch for volume depletion but now with high BP that does not seem to be an issue.   Sodium has corrected to normal 2.  Hypertension-  Argues against volume depletion.    On hydralazine- then  high dose ramipril was added.  Can probably be fine tuned as OP- would pick one agent and personally would say ramipril and drop to moderate dose of 5 daily   As sodium is normal-  Renal will sign off.  She needs to be continued on fluid restriction and lasix 10 daily for her SIADH, not for fluid.  She does not need renal specific follow up    [place]: Basic Metabolic Panel: Recent Labs  Lab 03/19/19 0348 03/19/19 1425 03/20/19 0432  NA 125* 125* 132*  K 4.0 3.3* 3.9  CL 94* 93* 98  CO2 23 24 24   GLUCOSE 115* 121* 107*  BUN 7* 8 10  CREATININE 0.76 0.80 0.70  CALCIUM 8.9 8.9 9.4  PHOS  --  2.3* 2.7   Liver Function Tests: Recent Labs   Lab 03/17/19 1050 03/19/19 1425 03/20/19 0432  AST 32  --   --   ALT 26  --   --   ALKPHOS 74  --   --   BILITOT 0.8  --   --   PROT 7.5  --   --   ALBUMIN 4.7 3.8 3.7   Recent Labs  Lab 03/17/19 1050  LIPASE 21   No results for input(s): AMMONIA in the last 168 hours. CBC: Recent Labs  Lab 03/17/19 1050 03/18/19 1332 03/19/19 1245  WBC 15.0* 14.4* 11.0*  HGB 13.6 12.9 13.4  HCT 38.2 36.7 38.2  MCV 80.8 80.7 82.7  PLT 320 225 219   Cardiac Enzymes: No results for input(s): CKTOTAL, CKMB, CKMBINDEX, TROPONINI in the last 168 hours. CBG: Recent Labs  Lab 03/17/19 2227 03/18/19 0646 03/18/19 1120 03/18/19 1648 03/18/19 2135  GLUCAP 141* 121* 112* 102* 122*    Iron Studies: No results for input(s): IRON, TIBC, TRANSFERRIN, FERRITIN in the last 72 hours. Studies/Results: No results found. Medications: Infusions:   Scheduled Medications: . enoxaparin (LOVENOX) injection  40 mg Subcutaneous  Daily  . levothyroxine  100 mcg Oral QAC breakfast  . mirtazapine  7.5 mg Oral QHS  . mometasone-formoterol  2 puff Inhalation BID  . pantoprazole  40 mg Oral Q0600  . ramipril  10 mg Oral BID  . sodium chloride flush  3 mL Intravenous Once    have reviewed scheduled and prn medications.  Physical Exam: General: NAD Heart: RRR Lungs: clear Abdomen: soft, non tender Extremities: no edema     03/20/2019,8:55 AM  LOS: 2 days

## 2019-03-20 NOTE — Plan of Care (Signed)
  Problem: Education: Goal: Knowledge of General Education information will improve Description: Including pain rating scale, medication(s)/side effects and non-pharmacologic comfort measures 03/20/2019 0135 by Jacolyn Reedy, RN Outcome: Progressing 03/20/2019 0135 by Jacolyn Reedy, RN Outcome: Progressing 03/20/2019 0135 by Jacolyn Reedy, RN Outcome: Progressing   Problem: Health Behavior/Discharge Planning: Goal: Ability to manage health-related needs will improve 03/20/2019 0135 by Jacolyn Reedy, RN Outcome: Progressing 03/20/2019 0135 by Jacolyn Reedy, RN Outcome: Progressing 03/20/2019 0135 by Jacolyn Reedy, RN Outcome: Progressing   Problem: Clinical Measurements: Goal: Ability to maintain clinical measurements within normal limits will improve 03/20/2019 0135 by Jacolyn Reedy, RN Outcome: Progressing 03/20/2019 0135 by Jacolyn Reedy, RN Outcome: Progressing 03/20/2019 0135 by Jacolyn Reedy, RN Outcome: Progressing Goal: Will remain free from infection 03/20/2019 0135 by Jacolyn Reedy, RN Outcome: Progressing 03/20/2019 0135 by Jacolyn Reedy, RN Outcome: Progressing 03/20/2019 0135 by Jacolyn Reedy, RN Outcome: Progressing Goal: Diagnostic test results will improve 03/20/2019 0135 by Jacolyn Reedy, RN Outcome: Progressing 03/20/2019 0135 by Jacolyn Reedy, RN Outcome: Progressing 03/20/2019 0135 by Jacolyn Reedy, RN Outcome: Progressing Goal: Respiratory complications will improve 03/20/2019 0135 by Jacolyn Reedy, RN Outcome: Progressing 03/20/2019 0135 by Jacolyn Reedy, RN Outcome: Progressing 03/20/2019 0135 by Jacolyn Reedy, RN Outcome: Progressing Goal: Cardiovascular complication will be avoided 03/20/2019 0135 by Jacolyn Reedy, RN Outcome:  Progressing 03/20/2019 0135 by Jacolyn Reedy, RN Outcome: Progressing 03/20/2019 0135 by Jacolyn Reedy, RN Outcome: Progressing   Problem: Activity: Goal: Risk for activity intolerance will decrease 03/20/2019 0135 by Jacolyn Reedy, RN Outcome: Progressing 03/20/2019 0135 by Jacolyn Reedy, RN Outcome: Progressing 03/20/2019 0135 by Jacolyn Reedy, RN Outcome: Progressing   Problem: Nutrition: Goal: Adequate nutrition will be maintained 03/20/2019 0135 by Jacolyn Reedy, RN Outcome: Progressing 03/20/2019 0135 by Jacolyn Reedy, RN Outcome: Progressing 03/20/2019 0135 by Jacolyn Reedy, RN Outcome: Progressing   Problem: Coping: Goal: Level of anxiety will decrease 03/20/2019 0135 by Jacolyn Reedy, RN Outcome: Progressing 03/20/2019 0135 by Jacolyn Reedy, RN Outcome: Progressing 03/20/2019 0135 by Jacolyn Reedy, RN Outcome: Progressing   Problem: Elimination: Goal: Will not experience complications related to bowel motility 03/20/2019 0135 by Jacolyn Reedy, RN Outcome: Progressing 03/20/2019 0135 by Jacolyn Reedy, RN Outcome: Progressing 03/20/2019 0135 by Jacolyn Reedy, RN Outcome: Progressing Goal: Will not experience complications related to urinary retention 03/20/2019 0135 by Jacolyn Reedy, RN Outcome: Progressing 03/20/2019 0135 by Jacolyn Reedy, RN Outcome: Progressing 03/20/2019 0135 by Jacolyn Reedy, RN Outcome: Progressing   Problem: Pain Managment: Goal: General experience of comfort will improve 03/20/2019 0135 by Jacolyn Reedy, RN Outcome: Progressing 03/20/2019 0135 by Jacolyn Reedy, RN Outcome: Progressing 03/20/2019 0135 by Jacolyn Reedy, RN Outcome: Progressing   Problem: Safety: Goal: Ability to remain free from injury will  improve 03/20/2019 0135 by Jacolyn Reedy, RN Outcome: Progressing 03/20/2019 0135 by Jacolyn Reedy, RN Outcome: Progressing 03/20/2019 0135 by Jacolyn Reedy, RN Outcome: Progressing   Problem: Skin Integrity: Goal: Risk for impaired skin integrity will decrease 03/20/2019 0135 by Jacolyn Reedy, RN Outcome: Progressing 03/20/2019 0135 by Jacolyn Reedy, RN Outcome: Progressing 03/20/2019 0135 by Jacolyn Reedy, RN Outcome: Progressing

## 2019-03-21 NOTE — Discharge Summary (Addendum)
Physician Discharge Summary  Lynn Stone [id number] DOB: May 13, 1950 DOA: 03/17/2019  PCP: Brett Canales, MD  Admit date: 03/17/2019 Discharge date: 03/20/2019  Admitted From: Home.  Disposition:  Home.   Recommendations for Outpatient Follow-up:  1. Follow up with PCP in 1-2 weeks 2. Please obtain BMP/CBC in one week 3. Please follow up with nephrology as needed.  4. Please follow up with gastroenterology for evaluation of gastritis.    Discharge Condition: stable.  CODE STATUS: full code.  Diet recommendation: Heart Healthy    Brief/Interim Summary: Lynn Stone a 68 y.o.femalewith medical history significant ofasthma, esophagitis, GERD, hypertension, hypothyroidism, presents to ED with complaints of nausea, vomiting and diarrhea for over a week,. She was found to have low sodium of 115 and potassium of 2.8.  She was admitted for evaluation of hyponatremia and diarrhea. Nephrology consulted. Recommeded fluid restriction and Lasix. Sodium improved to greater than 130  Discharge Diagnoses:  Active Problems:   Hypothyroidism   Type 2 diabetes mellitus without complication (HCC)   Migraine headache   Essential hypertension   Asthma   Esophagitis   Nausea with vomiting   Hyponatremia   Acute hyponatremia   Nausea, vomiting and diarrhea:  differential include gastritis vs gastroenteritis.  Symptoms improved with IV zofran and IV PPI.  Transition to oral PPI Advance her diet and she is able to tolerate soft diet without any issues. On discharge her symptoms of nausea, vomiting and diarrhea resolved.    Hyponatremia Improvement in sodium levels after fluid restriction and Lasix. 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 Cortisol is 22.  Nephrology consulted for further recommendations.  Recommended low dose lasix on discharge.  Pt had some dizziness with lasix 40 mg daily.     Hypothyroidism  Resume Synthroid   Asthma:  No wheezing heard, continue with albuterol  Insomnia: Psychiatry recommending Remeron.    Essential hypertension: Well controlled.    Hypokalemia:  Replaced    Discharge Instructions  Discharge Instructions    Diet - low sodium heart healthy   Complete by: As directed    Discharge instructions   Complete by: As directed    Follow up with PCPin one week.     Allergies as of 03/20/2019      Reactions   Eggs Or Egg-derived Products Shortness Of Breath   Erythromycin Rash      Medication List    TAKE these medications   Advair Diskus 250-50 MCG/DOSE Aepb Generic drug: Fluticasone-Salmeterol Inhale 1 puff into the lungs 2 (two) times daily.   albuterol 108 (90 Base) MCG/ACT inhaler Commonly known as: VENTOLIN HFA Inhale into the lungs every 6 (six) hours as needed for wheezing or shortness of breath.   albuterol 108 (90 Base) MCG/ACT inhaler Commonly known as: VENTOLIN HFA Inhale 2 puffs into the lungs every 4 (four) hours as needed for wheezing or shortness of breath.   esomeprazole 40 MG capsule Commonly known as: NEXIUM Take 1 capsule (40 mg total) by mouth daily at 12 noon.   fluticasone 50 MCG/ACT nasal spray Commonly known as: FLONASE 1 or 2 sprays each nostril twice a day   furosemide 20 MG tablet Commonly known as: Lasix Take 0.5 tablets (10 mg total) by mouth daily.   levothyroxine 88 MCG tablet Commonly known as: SYNTHROID Take 1 tablet (88 mcg total) by mouth daily before breakfast. What changed:   medication strength  how  much to take   mirtazapine 7.5 MG tablet Commonly known as: REMERON Take 1 tablet (7.5 mg total) by mouth at bedtime.   ondansetron 4 MG tablet Commonly known as: Zofran Take 1 tablet (4 mg total) by mouth daily as needed for nausea or vomiting.   ramipril 10 MG capsule Commonly known as: ALTACE Take 1 capsule (10 mg total) by mouth 2 (two) times  daily. What changed: when to take this      Follow-up Information    Rosita Fire, MD. Schedule an appointment as soon as possible for a visit in 2 week(s).   Specialties: Nephrology, Internal Medicine Contact information: [place] [address] [phone]        Brett Canales, MD. Schedule an appointment as soon as possible for a visit in 1 week.   Specialty: Pediatrics Why: Please follow up in a week       [facility]. Schedule an appointment as soon as possible for a visit in 2 week(s).   Specialty: Gastroenterology Why: GERD, Contact information: [place] [phone]         Allergies  Allergen Reactions  . Eggs Or Egg-Derived Products Shortness Of Breath  . Erythromycin Rash    Consultations:  Nephrology.    Procedures/Studies: CT HEAD WO CONTRAST  Result Date: 03/17/2019 CLINICAL DATA:  68 year old female with history of acute onset of a headache. Epigastric pain with vomiting for the past 2 days. EXAM: CT HEAD WITHOUT CONTRAST TECHNIQUE: Contiguous axial images were obtained from the base of the skull through the vertex without intravenous contrast. COMPARISON:  No priors. FINDINGS: Brain: Patchy areas of decreased attenuation are noted throughout the deep and periventricular white matter of the cerebral hemispheres bilaterally, compatible with mild chronic microvascular ischemic disease. No evidence of acute infarction, hemorrhage, hydrocephalus, extra-axial collection or mass lesion/mass effect. Vascular: No hyperdense vessel or unexpected calcification. Skull: Normal. Negative for fracture or focal lesion. Sinuses/Orbits: No acute finding. Other: None. IMPRESSION: 1. No acute intracranial abnormalities. 2. Mild chronic microvascular ischemic changes in the cerebral white matter. Electronically Signed   By: Vinnie Langton M.D.   On: 03/17/2019 19:52   DG Chest Port 1  View  Result Date: 03/17/2019 CLINICAL DATA:  Chest pain EXAM: PORTABLE CHEST 1 VIEW COMPARISON:  10/06/2009 FINDINGS: Heart size upper normal. Negative for heart failure. Lungs well aerated without infiltrate effusion or mass. No acute skeletal abnormality. IMPRESSION: No active disease. Electronically Signed   By: Franchot Gallo M.D.   On: 03/17/2019 13:22       Subjective: No new complaints.   Discharge Exam: Vitals:   03/20/19 0738 03/20/19 1215  BP: (!) 152/74 (!) 130/97  Pulse: 84   Resp:    Temp: 98.6 F (37 C)   SpO2: 97%    Vitals:   03/19/19 2100 03/20/19 0439 03/20/19 0738 03/20/19 1215  BP: 123/65 (!) 162/80 (!) 152/74 (!) 130/97  Pulse: 88 88 84   Resp: 20 18    Temp: 98.6 F (37 C) 98.6 F (37 C) 98.6 F (37 C)   TempSrc: Oral Oral Oral   SpO2: 95% 100% 97%   Weight:        General: Pt is alert, awake, not in acute distress Cardiovascular: RRR, S1/S2 +, no rubs, no gallops Respiratory: CTA bilaterally, no wheezing, no rhonchi Abdominal: Soft, NT, ND, bowel sounds + Extremities: no edema, no cyanosis    The results of significant  diagnostics from this hospitalization (including imaging, microbiology, ancillary and laboratory) are listed below for reference.     Microbiology: Recent Results (from the past 240 hour(s))  SARS CORONAVIRUS 2 (TAT 6-24 HRS) Nasopharyngeal Nasopharyngeal Swab     Status: None   Collection Time: 03/17/19  1:37 PM   Specimen: Nasopharyngeal Swab  Result Value Ref Range Status   SARS Coronavirus 2 NEGATIVE NEGATIVE Final    Comment: (NOTE) SARS-CoV-2 target nucleic acids are NOT DETECTED. The SARS-CoV-2 RNA is generally detectable in upper and lower respiratory specimens during the acute phase of infection. Negative results do not preclude SARS-CoV-2 infection, do not rule out co-infections with other pathogens, and should not be used as the sole basis for treatment or other patient management decisions. Negative  results must be combined with clinical observations, patient history, and epidemiological information. The expected result is Negative. Fact Sheet for Patients: SugarRoll.be Fact Sheet for Healthcare Providers: https://www.woods-mathews.com/ This test is not yet approved or cleared by the [place] FDA and  has been authorized for detection and/or diagnosis of SARS-CoV-2 by FDA under an Emergency Use Authorization (EUA). This EUA will remain  in effect (meaning this test can be used) for the duration of the COVID-19 declaration under Section 56 4(b)(1) of the Act, 21 U.S.C. section 360bbb-3(b)(1), unless the authorization is terminated or revoked sooner. Performed at [facility], [place] [address], [place], [place] [address]      Labs: BNP (last 3 results) No results for input(s): BNP in the last 8760 hours. Basic Metabolic Panel: Recent Labs  Lab 03/17/19 1220 03/18/19 1332 03/18/19 1943 03/19/19 0348 03/19/19 1425 03/20/19 0432  NA  --  118* 116* 125* 125* 132*  K  --  2.7* 3.6 4.0 3.3* 3.9  CL  --  85* 85* 94* 93* 98  CO2  --  23 22 23 24 24   GLUCOSE  --  162* 114* 115* 121* 107*  BUN  --  6* 7* 7* 8 10  CREATININE  --  0.64 0.67 0.76 0.80 0.70  CALCIUM  --  8.5* 8.6* 8.9 8.9 9.4  MG 1.9  --   --   --   --   --   PHOS  --   --   --   --  2.3* 2.7   Liver Function Tests: Recent Labs  Lab 03/17/19 1050 03/19/19 1425 03/20/19 0432  AST 32  --   --   ALT 26  --   --   ALKPHOS 74  --   --   BILITOT 0.8  --   --   PROT 7.5  --   --   ALBUMIN 4.7 3.8 3.7   Recent Labs  Lab 03/17/19 1050  LIPASE 21   No results for input(s): AMMONIA in the last 168 hours. CBC: Recent Labs  Lab 03/17/19 1050 03/18/19 1332 03/19/19 1245  WBC 15.0* 14.4* 11.0*  HGB 13.6 12.9 13.4  HCT 38.2 36.7 38.2  MCV 80.8 80.7 82.7  PLT 320 225 219   Cardiac Enzymes: No results for input(s): CKTOTAL, CKMB, CKMBINDEX, TROPONINI in the  last 168 hours. BNP: Invalid input(s): POCBNP CBG: Recent Labs  Lab 03/17/19 2227 03/18/19 0646 03/18/19 1120 03/18/19 1648 03/18/19 2135  GLUCAP 141* 121* 112* 102* 122*   D-Dimer No results for input(s): DDIMER in the last 72 hours. Hgb A1c No results for input(s): HGBA1C in the last 72 hours. Lipid Profile No results for input(s): CHOL, HDL, LDLCALC,  TRIG, CHOLHDL, LDLDIRECT in the last 72 hours. Thyroid function studies No results for input(s): TSH, T4TOTAL, T3FREE, THYROIDAB in the last 72 hours.  Invalid input(s): FREET3 Anemia work up No results for input(s): VITAMINB12, FOLATE, FERRITIN, TIBC, IRON, RETICCTPCT in the last 72 hours. Urinalysis    Component Value Date/Time   COLORURINE STRAW (A) 03/17/2019 2001   APPEARANCEUR CLEAR 03/17/2019 2001   [place] 1.009 03/17/2019 2001   PHURINE 7.0 03/17/2019 2001   GLUCOSEU 50 (A) 03/17/2019 2001   HGBUR NEGATIVE 03/17/2019 2001   [place] NEGATIVE 03/17/2019 2001   KETONESUR 20 (A) 03/17/2019 2001   PROTEINUR 30 (A) 03/17/2019 2001   UROBILINOGEN 0.2 10/06/2009 1915   NITRITE NEGATIVE 03/17/2019 2001   LEUKOCYTESUR NEGATIVE 03/17/2019 2001   Sepsis Labs Invalid input(s): PROCALCITONIN,  WBC,  LACTICIDVEN Microbiology Recent Results (from the past 240 hour(s))  SARS CORONAVIRUS 2 (TAT 6-24 HRS) Nasopharyngeal Nasopharyngeal Swab     Status: None   Collection Time: 03/17/19  1:37 PM   Specimen: Nasopharyngeal Swab  Result Value Ref Range Status   SARS Coronavirus 2 NEGATIVE NEGATIVE Final    Comment: (NOTE) SARS-CoV-2 target nucleic acids are NOT DETECTED. The SARS-CoV-2 RNA is generally detectable in upper and lower respiratory specimens during the acute phase of infection. Negative results do not preclude SARS-CoV-2 infection, do not rule out co-infections with other pathogens, and should not be used as the sole basis for treatment or other patient management decisions. Negative results must be combined  with clinical observations, patient history, and epidemiological information. The expected result is Negative. Fact Sheet for Patients: SugarRoll.be Fact Sheet for Healthcare Providers: https://www.woods-mathews.com/ This test is not yet approved or cleared by the [place] FDA and  has been authorized for detection and/or diagnosis of SARS-CoV-2 by FDA under an Emergency Use Authorization (EUA). This EUA will remain  in effect (meaning this test can be used) for the duration of the COVID-19 declaration under Section 56 4(b)(1) of the Act, 21 U.S.C. section 360bbb-3(b)(1), unless the authorization is terminated or revoked sooner. Performed at [facility], [place] [address], [place], [place] [address]      Time coordinating discharge: 32 minutes  SIGNED:   Hosie Poisson, MD  [facility]

## 2019-03-23 LAB — GI PATHOGEN PANEL BY PCR, STOOL

## 2019-05-01 ENCOUNTER — Other Ambulatory Visit (HOSPITAL_BASED_OUTPATIENT_CLINIC_OR_DEPARTMENT_OTHER): Payer: Self-pay | Admitting: Nephrology

## 2019-05-01 DIAGNOSIS — D1771 Benign lipomatous neoplasm of kidney: Secondary | ICD-10-CM

## 2019-05-01 DIAGNOSIS — D3001 Benign neoplasm of right kidney: Secondary | ICD-10-CM

## 2019-05-07 ENCOUNTER — Other Ambulatory Visit: Payer: Self-pay

## 2019-05-07 ENCOUNTER — Ambulatory Visit (HOSPITAL_BASED_OUTPATIENT_CLINIC_OR_DEPARTMENT_OTHER): Payer: Medicare HMO

## 2019-05-07 ENCOUNTER — Ambulatory Visit (HOSPITAL_BASED_OUTPATIENT_CLINIC_OR_DEPARTMENT_OTHER)
Admission: RE | Admit: 2019-05-07 | Discharge: 2019-05-07 | Disposition: A | Discharge status: Home / Self Care | Payer: Medicare HMO | Source: Ambulatory Visit | Attending: Nephrology | Admitting: Nephrology

## 2019-05-07 DIAGNOSIS — D1771 Benign lipomatous neoplasm of kidney: Secondary | ICD-10-CM

## 2019-05-07 DIAGNOSIS — D3001 Benign neoplasm of right kidney: Secondary | ICD-10-CM | POA: Insufficient documentation

## 2019-05-20 ENCOUNTER — Other Ambulatory Visit (HOSPITAL_BASED_OUTPATIENT_CLINIC_OR_DEPARTMENT_OTHER): Payer: Self-pay | Admitting: Family Medicine

## 2019-05-20 DIAGNOSIS — M546 Pain in thoracic spine: Secondary | ICD-10-CM

## 2019-05-22 ENCOUNTER — Other Ambulatory Visit: Payer: Self-pay

## 2019-05-22 ENCOUNTER — Ambulatory Visit (HOSPITAL_BASED_OUTPATIENT_CLINIC_OR_DEPARTMENT_OTHER)
Admission: RE | Admit: 2019-05-22 | Discharge: 2019-05-22 | Disposition: A | Discharge status: Home / Self Care | Payer: Medicare HMO | Source: Ambulatory Visit | Attending: Family Medicine | Admitting: Family Medicine

## 2019-05-22 DIAGNOSIS — M546 Pain in thoracic spine: Secondary | ICD-10-CM | POA: Diagnosis not present

## 2019-06-07 ENCOUNTER — Ambulatory Visit: Payer: Medicare HMO | Attending: Internal Medicine

## 2019-06-07 DIAGNOSIS — Z23 Encounter for immunization: Secondary | ICD-10-CM

## 2019-06-07 NOTE — Progress Notes (Signed)
   Covid-19 Vaccination Clinic  Name:  Lynn Stone    MRN: [id number] DOB: [birth year]  06/07/2019  Lynn Stone was observed post Covid-19 immunization for 15 minutes without incident. She was provided with Vaccine Information Sheet and instruction to access the V-Safe system.   Lynn Stone was instructed to call 911 with any severe reactions post vaccine: Marland Kitchen Difficulty breathing  . Swelling of face and throat  . A fast heartbeat  . A bad rash all over body  . Dizziness and weakness   Immunizations Administered    Name Date Dose VIS Date Route   Pfizer COVID-19 Vaccine 06/07/2019  9:40 AM 0.3 mL 03/07/2019 Intramuscular   Manufacturer: [place]   Lot: [id number]   [place]: [id number]

## 2019-07-02 ENCOUNTER — Ambulatory Visit: Payer: Medicare HMO | Attending: Internal Medicine

## 2019-07-02 DIAGNOSIS — Z23 Encounter for immunization: Secondary | ICD-10-CM

## 2019-07-02 NOTE — Progress Notes (Signed)
   Covid-19 Vaccination Clinic  Name:  Ilda Kreitz    MRN: [id number] DOB: Aug 07, 1950  07/02/2019  Ms. Markuson was observed post Covid-19 immunization for 15 minutes without incident. She was provided with Vaccine Information Sheet and instruction to access the V-Safe system.   Ms. Denby was instructed to call 911 with any severe reactions post vaccine: Marland Kitchen Difficulty breathing  . Swelling of face and throat  . A fast heartbeat  . A bad rash all over body  . Dizziness and weakness   Immunizations Administered    Name Date Dose VIS Date Route   Pfizer COVID-19 Vaccine 07/02/2019 10:35 AM 0.3 mL 03/07/2019 Intramuscular   Manufacturer: [employer], [employer]   Lot: [id number]   [place]: [id number]

## 2021-11-12 IMAGING — CT CT HEAD W/O CM
4 series · 16 of 47 positions shown, 18 images · non-contrast
Comparison: No priors.

CLINICAL DATA: 68-year-old female with history of acute onset of a
headache. Epigastric pain with vomiting for the past 2 days.

EXAM:
CT HEAD WITHOUT CONTRAST
TECHNIQUE: Contiguous axial images were obtained from the base of the skull
through the vertex without intravenous contrast.

[Series 3: head without · axial · non-contrast · 0.39mm/px · z∈[+1094,+1214]mm · 7 of 34 slices shown, 9 images]
[im 5/34  brain]
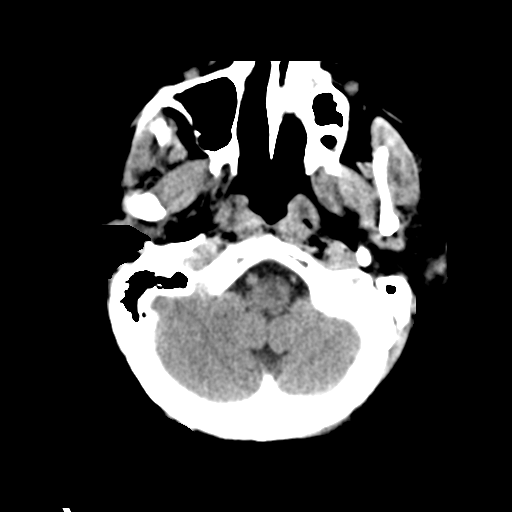
[im 5/34  bone]
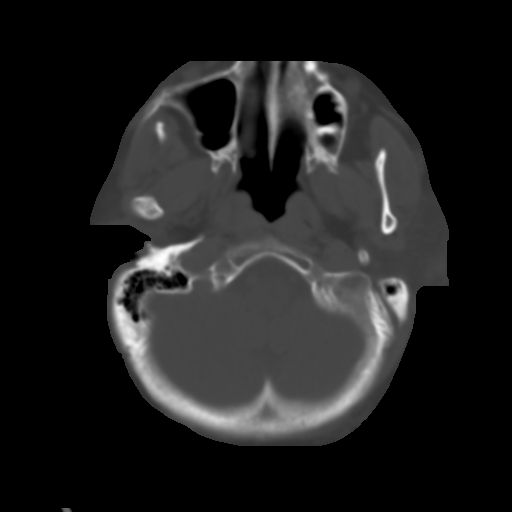
[im 9/34  brain]
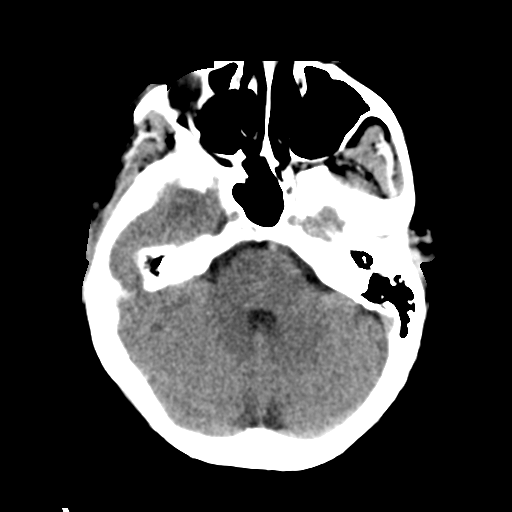
[im 13/34  brain]
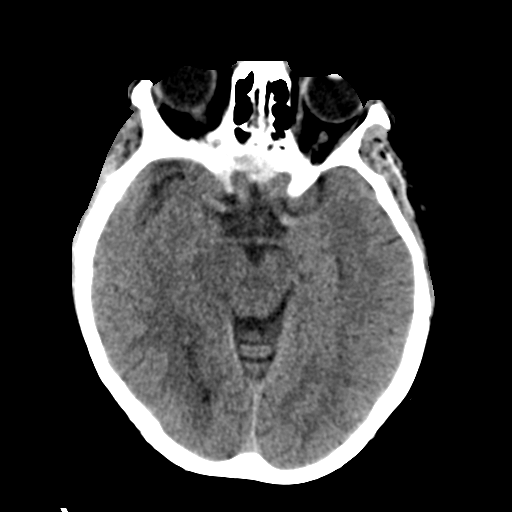
[im 17/34  brain]
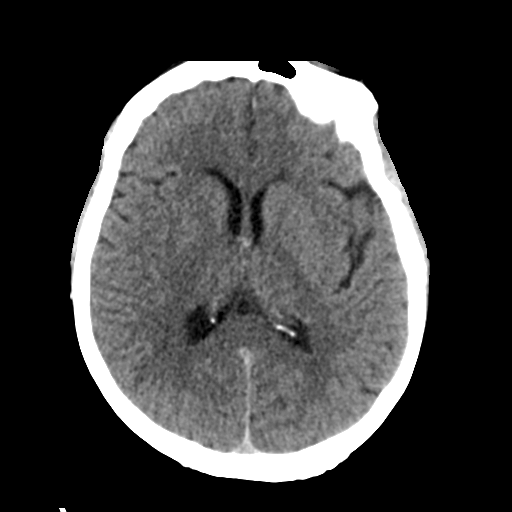
[im 21/34  brain]
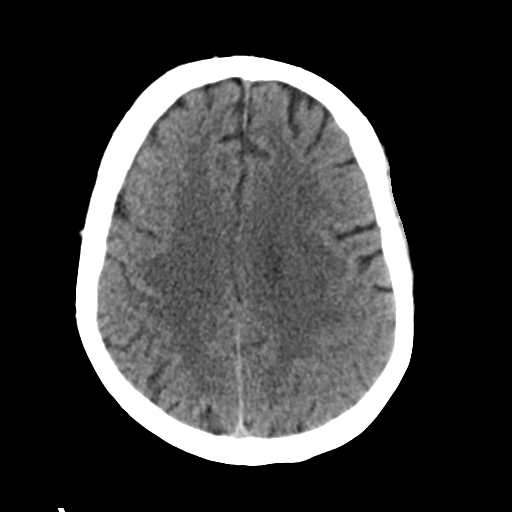
[im 21/34  bone]
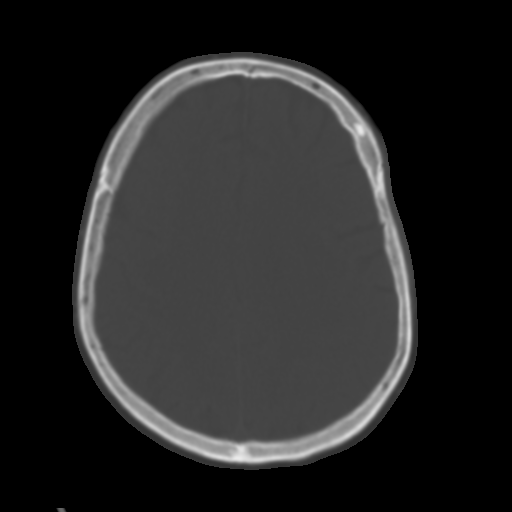
[im 25/34  brain]
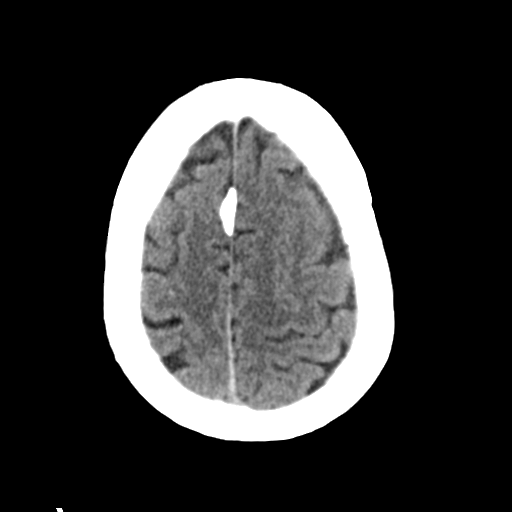
[im 29/34  brain]
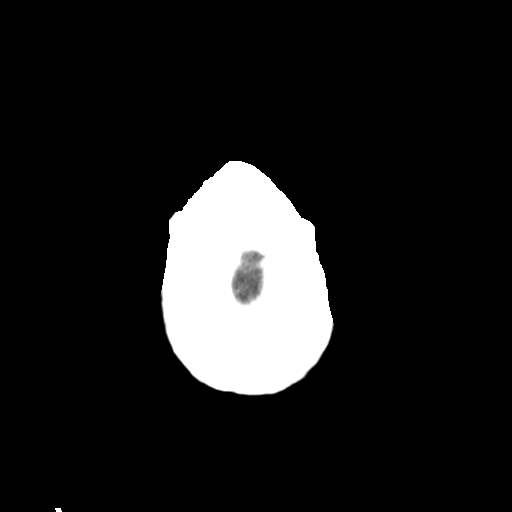

[Series 4: head bone · axial · 0.39mm/px · z∈[+1090,+1122]mm · 3 of 84 slices shown]
[im 9/84  bone]
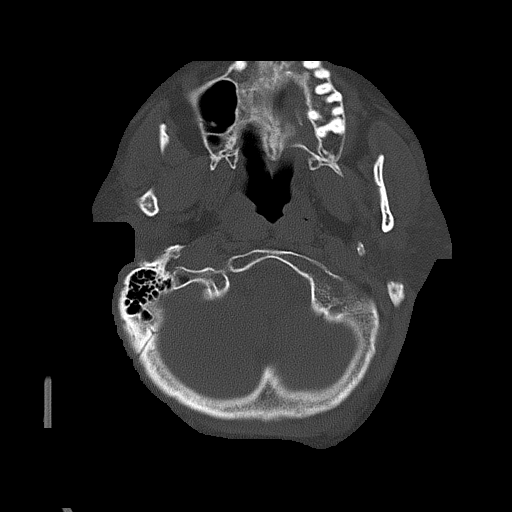
[im 17/84  bone]
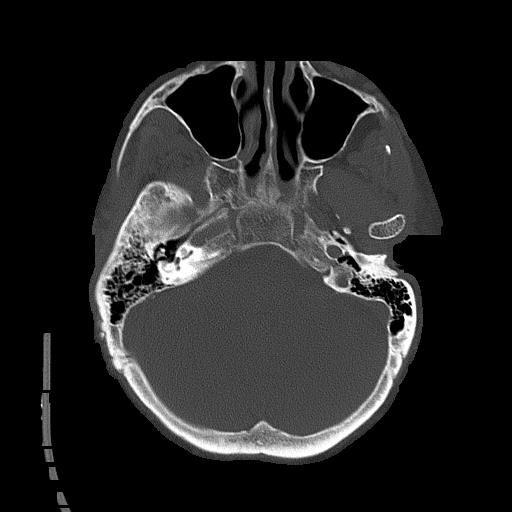
[im 25/84  bone]
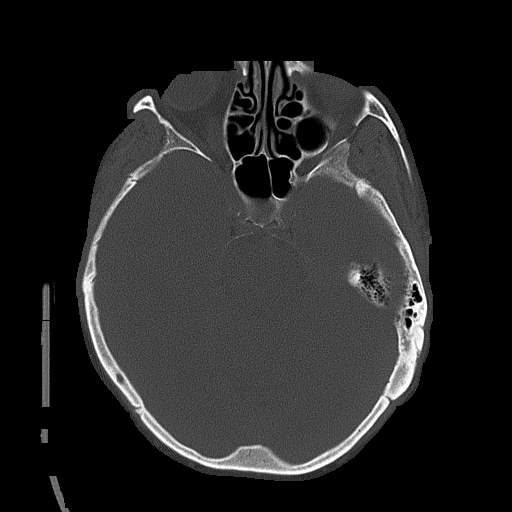

[Series 5: head without cor · coronal · non-contrast · 0.33mm/px · 3 of 67 slices shown]
[im 23/67  brain]
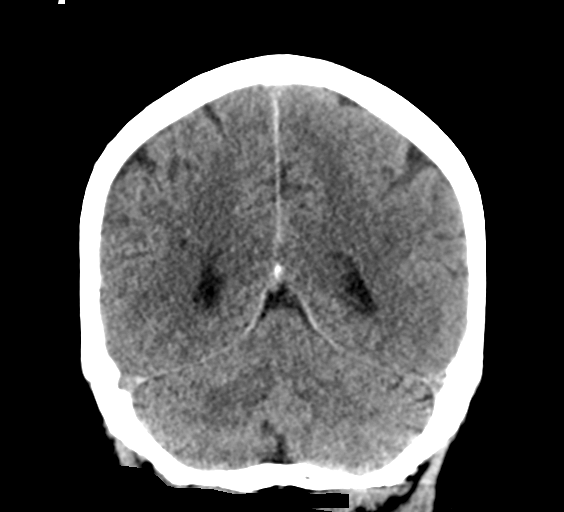
[im 30/67  brain]
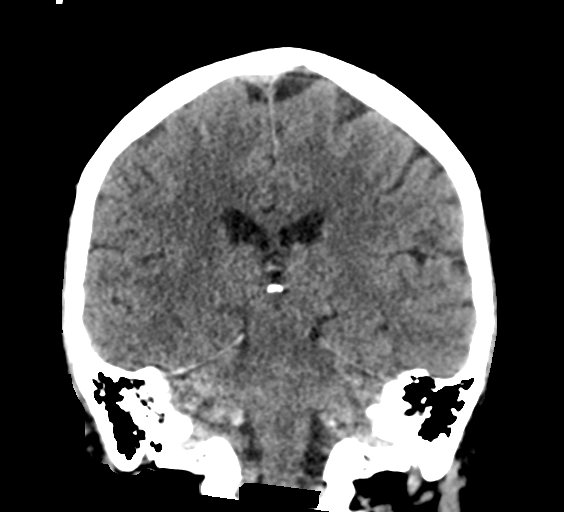
[im 37/67  brain]
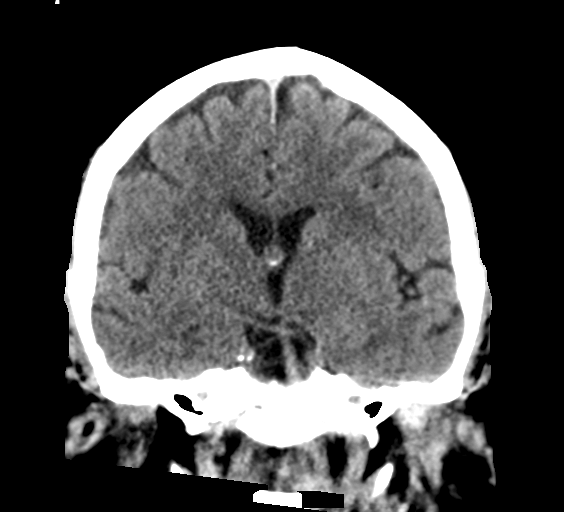

[Series 6: head without sag · sagittal · non-contrast · 0.33mm/px · 3 of 60 slices shown]
[im 20/60  brain]
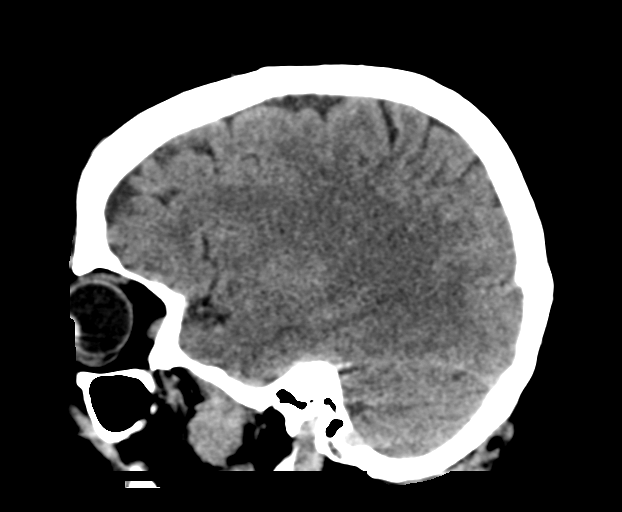
[im 30/60  brain]
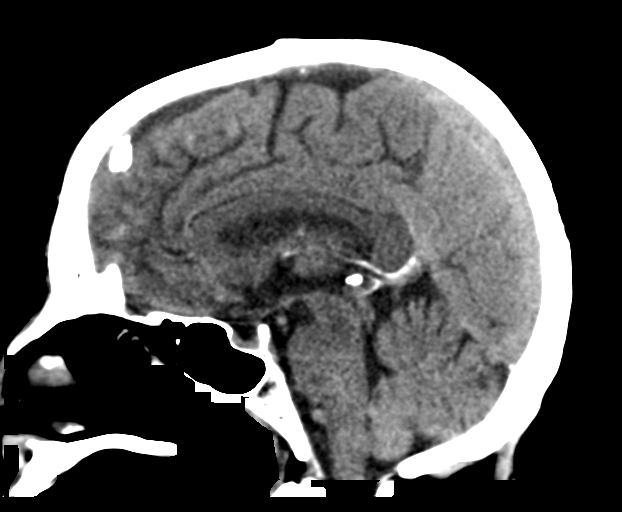
[im 40/60  brain]
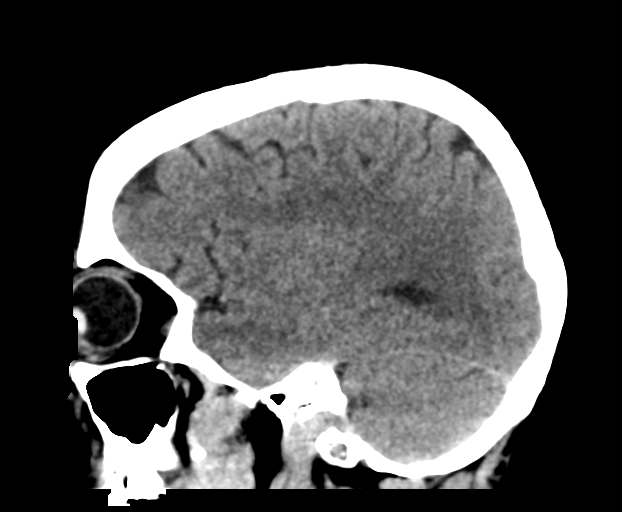

[16 of 47 positions shown; findings below may reference images not displayed]

FINDINGS: Brain: Patchy areas of decreased attenuation are noted throughout
the deep and periventricular white matter of the cerebral
hemispheres bilaterally, compatible with mild chronic microvascular
ischemic disease. No evidence of acute infarction, hemorrhage,
hydrocephalus, extra-axial collection or mass lesion/mass effect.

Vascular: No hyperdense vessel or unexpected calcification.

Skull: Normal. Negative for fracture or focal lesion.

Sinuses/Orbits: No acute finding.

Other: None.
IMPRESSION: 1. No acute intracranial abnormalities.
2. Mild chronic microvascular ischemic changes in the cerebral white
matter.

## 2022-01-17 IMAGING — CR DG THORACIC SPINE 2V
3 series · 3 of 3 positions shown · non-contrast
Comparison: None.

CLINICAL DATA: Thoracic spine pain, rule out compression deformity.

EXAM:
THORACIC SPINE 2 VIEWS

[w t-spine a.p. *]
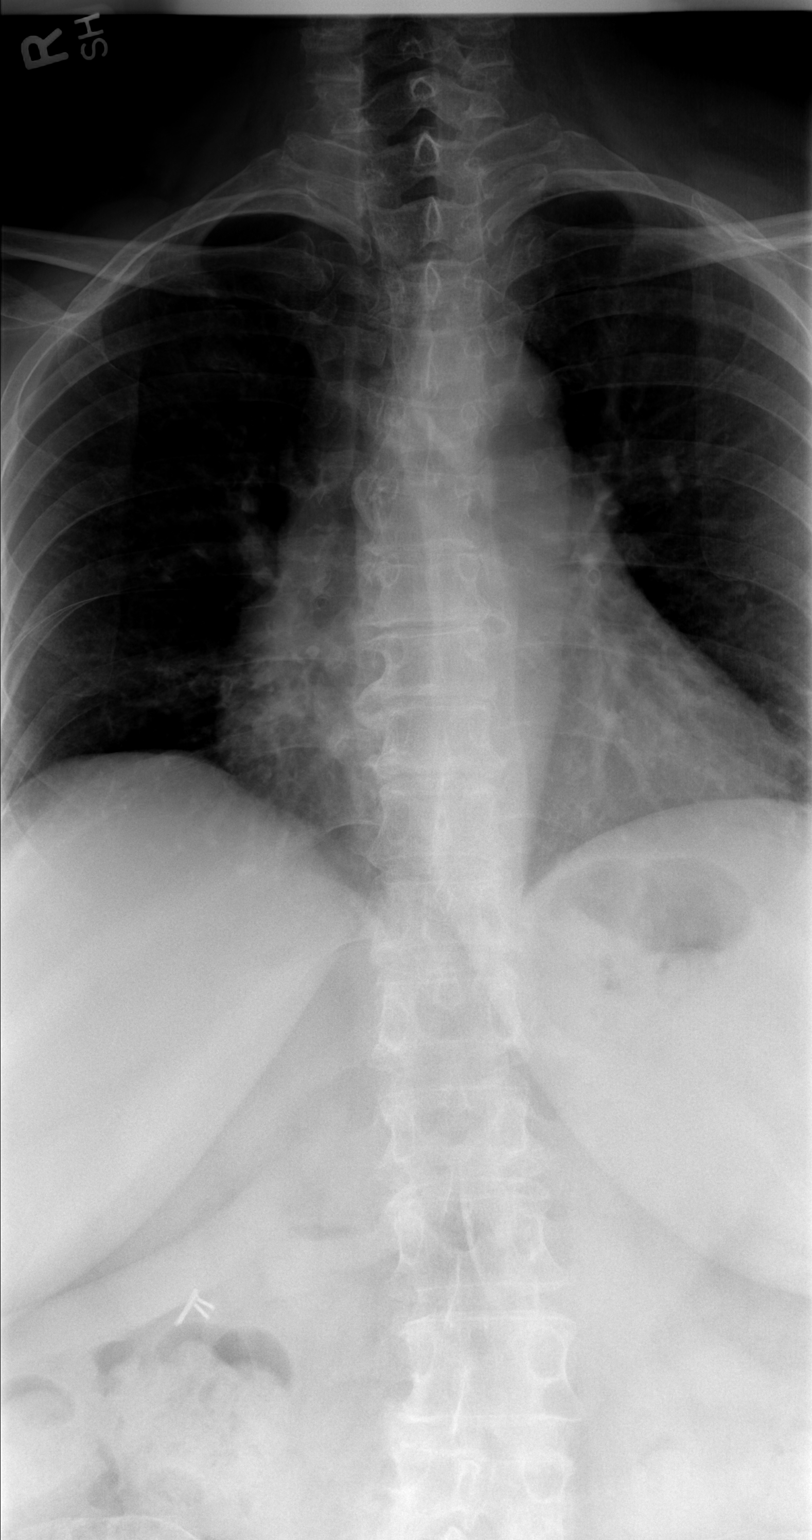

[w t-spine lat *]
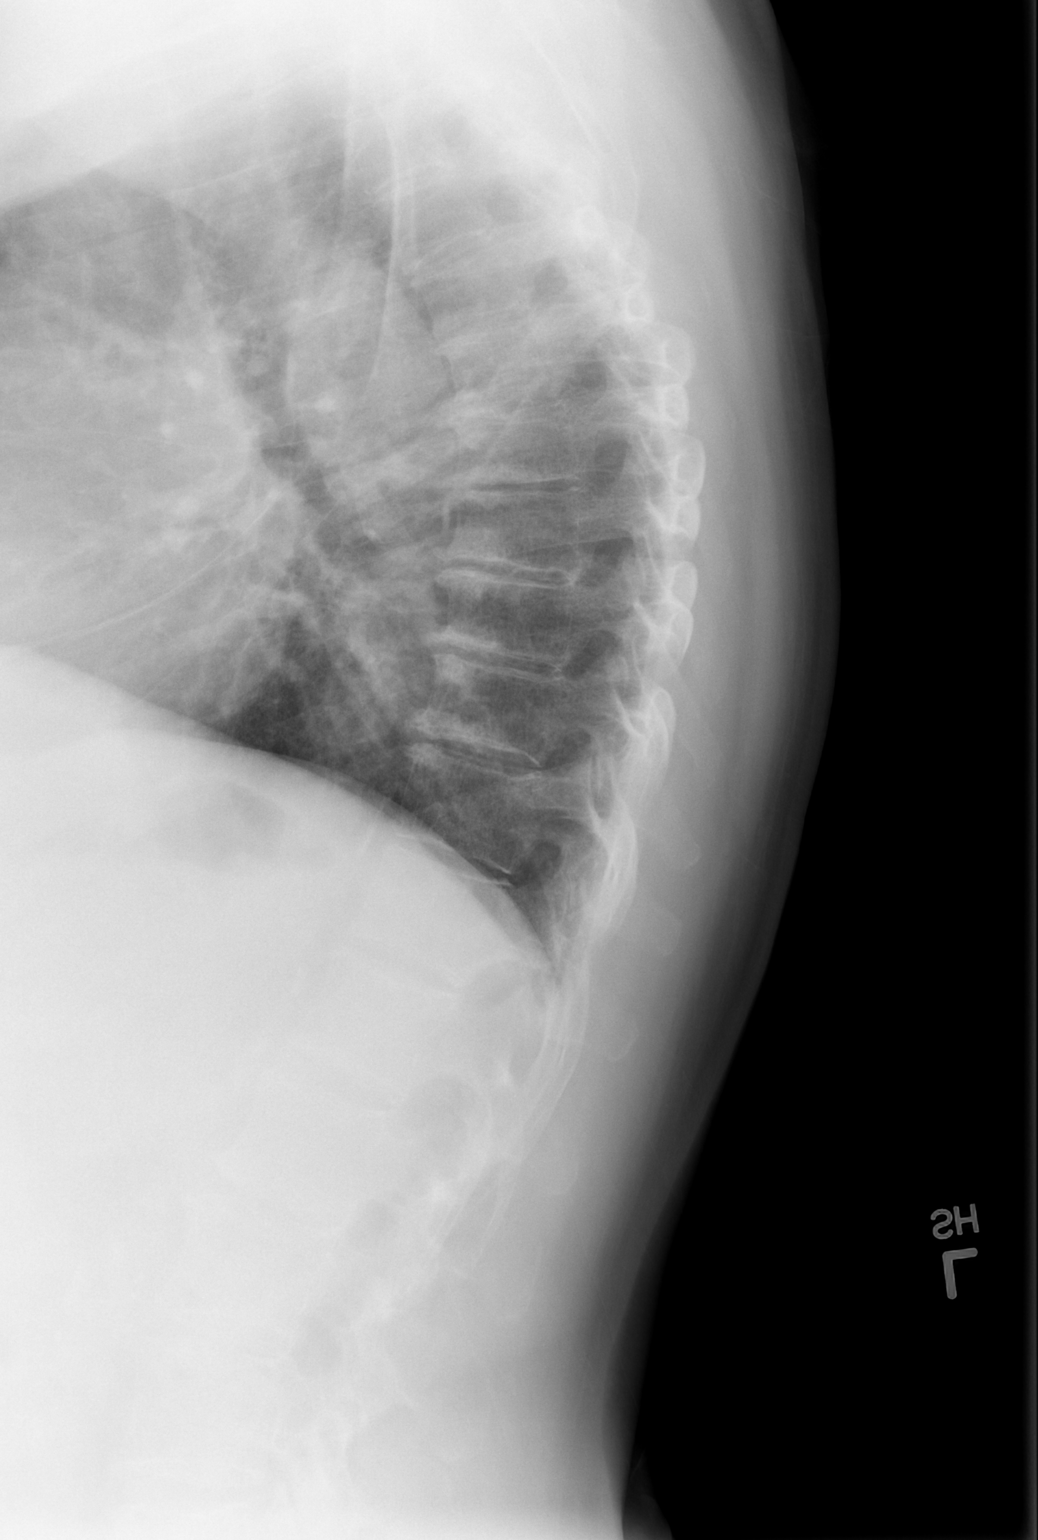

[w swimmers view]
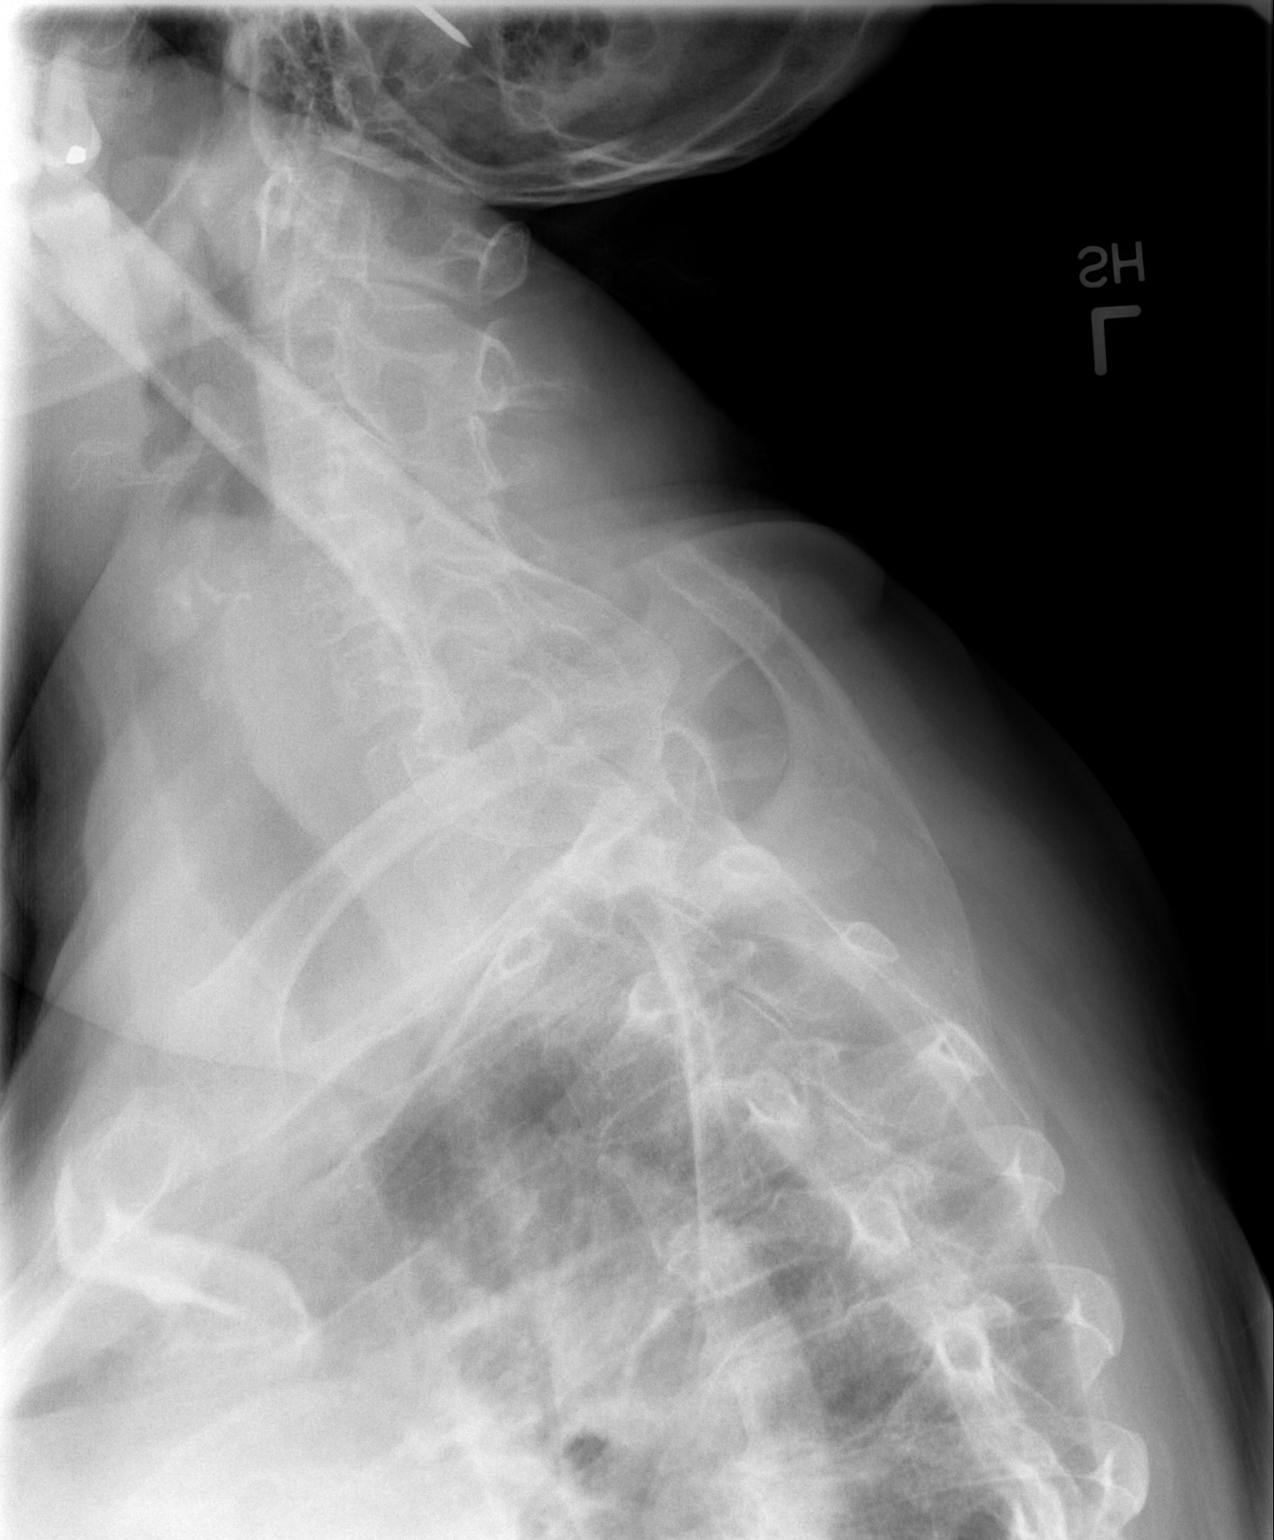

[3 of 3 positions shown; findings below may reference images not displayed]

FINDINGS: There is no evidence of thoracic spine fracture. No traumatic
listhesis. Multilevel degenerative changes are present in the imaged
portions of the spine. Included portions of the lungs and
mediastinum are unremarkable. Surgical clips in the right upper
quadrant, likely related to prior cholecystectomy.
IMPRESSION: No acute findings.

## 2023-12-27 ENCOUNTER — Encounter (INDEPENDENT_AMBULATORY_CARE_PROVIDER_SITE_OTHER): Payer: Self-pay
